# Patient Record
Sex: Female | Born: 1955 | Race: White | Hispanic: No | Marital: Married | Smoking: Former smoker
Health system: Southern US, Community
[De-identification: ages and names within clinical notes are randomized; demographics above are authoritative.]

## PROBLEM LIST (undated history)

## (undated) DIAGNOSIS — M1990 Unspecified osteoarthritis, unspecified site: Secondary | ICD-10-CM

## (undated) DIAGNOSIS — R0602 Shortness of breath: Secondary | ICD-10-CM

## (undated) DIAGNOSIS — Z8719 Personal history of other diseases of the digestive system: Secondary | ICD-10-CM

## (undated) DIAGNOSIS — K8020 Calculus of gallbladder without cholecystitis without obstruction: Secondary | ICD-10-CM

## (undated) DIAGNOSIS — J189 Pneumonia, unspecified organism: Secondary | ICD-10-CM

## (undated) DIAGNOSIS — S37009A Unspecified injury of unspecified kidney, initial encounter: Secondary | ICD-10-CM

## (undated) DIAGNOSIS — J90 Pleural effusion, not elsewhere classified: Secondary | ICD-10-CM

## (undated) DIAGNOSIS — J449 Chronic obstructive pulmonary disease, unspecified: Secondary | ICD-10-CM

## (undated) DIAGNOSIS — K219 Gastro-esophageal reflux disease without esophagitis: Secondary | ICD-10-CM

## (undated) DIAGNOSIS — I10 Essential (primary) hypertension: Secondary | ICD-10-CM

## (undated) DIAGNOSIS — E1340 Other specified diabetes mellitus with diabetic neuropathy, unspecified: Secondary | ICD-10-CM

## (undated) HISTORY — PX: ABDOMINAL HYSTERECTOMY: SHX81

## (undated) HISTORY — DX: Chronic obstructive pulmonary disease, unspecified: J44.9

## (undated) HISTORY — DX: Calculus of gallbladder without cholecystitis without obstruction: K80.20

## (undated) HISTORY — DX: Pleural effusion, not elsewhere classified: J90

## (undated) HISTORY — PX: BRONCHOSCOPY: SUR163

## (undated) HISTORY — DX: Other specified diabetes mellitus with diabetic neuropathy, unspecified: E13.40

## (undated) HISTORY — PX: BREAST SURGERY: SHX581

## (undated) HISTORY — DX: Unspecified injury of unspecified kidney, initial encounter: S37.009A

## (undated) HISTORY — DX: Pneumonia, unspecified organism: J18.9

## (undated) HISTORY — PX: THORACENTESIS: SHX235

---

## 2014-01-19 DIAGNOSIS — J9620 Acute and chronic respiratory failure, unspecified whether with hypoxia or hypercapnia: Secondary | ICD-10-CM | POA: Insufficient documentation

## 2014-01-19 DIAGNOSIS — A419 Sepsis, unspecified organism: Secondary | ICD-10-CM | POA: Insufficient documentation

## 2014-01-19 DIAGNOSIS — B999 Unspecified infectious disease: Secondary | ICD-10-CM | POA: Insufficient documentation

## 2014-01-19 DIAGNOSIS — E119 Type 2 diabetes mellitus without complications: Secondary | ICD-10-CM | POA: Insufficient documentation

## 2014-01-19 DIAGNOSIS — J189 Pneumonia, unspecified organism: Secondary | ICD-10-CM

## 2014-01-19 DIAGNOSIS — S37009A Unspecified injury of unspecified kidney, initial encounter: Secondary | ICD-10-CM | POA: Insufficient documentation

## 2014-01-19 HISTORY — DX: Pneumonia, unspecified organism: J18.9

## 2014-01-19 HISTORY — DX: Unspecified injury of unspecified kidney, initial encounter: S37.009A

## 2014-02-03 ENCOUNTER — Encounter: Payer: Self-pay | Admitting: *Deleted

## 2014-02-03 DIAGNOSIS — J90 Pleural effusion, not elsewhere classified: Secondary | ICD-10-CM | POA: Insufficient documentation

## 2014-02-03 DIAGNOSIS — J449 Chronic obstructive pulmonary disease, unspecified: Secondary | ICD-10-CM | POA: Insufficient documentation

## 2014-02-04 ENCOUNTER — Other Ambulatory Visit: Payer: Self-pay

## 2014-02-04 ENCOUNTER — Institutional Professional Consult (permissible substitution) (INDEPENDENT_AMBULATORY_CARE_PROVIDER_SITE_OTHER): Payer: Medicare Other | Admitting: Cardiothoracic Surgery

## 2014-02-04 ENCOUNTER — Encounter: Payer: Self-pay | Admitting: Cardiothoracic Surgery

## 2014-02-04 VITALS — BP 76/53 | HR 108 | Resp 18 | Ht 65.0 in | Wt 218.0 lb

## 2014-02-04 DIAGNOSIS — E1349 Other specified diabetes mellitus with other diabetic neurological complication: Secondary | ICD-10-CM | POA: Diagnosis not present

## 2014-02-04 DIAGNOSIS — E1340 Other specified diabetes mellitus with diabetic neuropathy, unspecified: Secondary | ICD-10-CM

## 2014-02-04 DIAGNOSIS — J869 Pyothorax without fistula: Secondary | ICD-10-CM

## 2014-02-04 DIAGNOSIS — J90 Pleural effusion, not elsewhere classified: Secondary | ICD-10-CM

## 2014-02-04 DIAGNOSIS — E1142 Type 2 diabetes mellitus with diabetic polyneuropathy: Secondary | ICD-10-CM

## 2014-02-04 DIAGNOSIS — K8020 Calculus of gallbladder without cholecystitis without obstruction: Secondary | ICD-10-CM | POA: Insufficient documentation

## 2014-02-04 HISTORY — DX: Other specified diabetes mellitus with diabetic neuropathy, unspecified: E13.40

## 2014-02-04 HISTORY — DX: Calculus of gallbladder without cholecystitis without obstruction: K80.20

## 2014-02-04 NOTE — Progress Notes (Signed)
[address]       [place] [address]             [phone]                    [name] [place] Medical Record #[id number] Date of Birth: May 28, 1956  Referring: Bethanie Dicker, MD Primary Care: Zollie Pee  Chief Complaint:    Chief Complaint  Patient presents with  . Pleural Effusion    left...has had thoracentesis.  Bronchoscopy by Dr. Dallas Breeding @ [facility]    History of Present Illness:    Judy Reeves 58 y.o. female is seen in the office  today for developing left empyema. The patient was recently hospitalized at [facility] approximately 2 weeks ago with a complex consolidated left lower lobe pneumonia, was treated with antibiotics cultured strep  from bronchoscopy. Moderate effusion was noted on previous CT scan. She was discharged home, which she's been gradually improving. And follow up with Dr. Eulis Foster yesterday was noted to have an increasing left pleural effusion. Thoracentesis was attempted but with little return, followup CT scan was performed demonstrating increasing left effusion/empyema. The patient has completed her course of Levaquin. She denies any fever chills. She's been on home oxygen for the past 10-15 years, quit smoking 9-10 years.  She is referred for consideration of left lung decortication.    Current Activity/ Functional Status:  Patient is independent with mobility/ambulation, transfers, ADL's, IADL's.   Zubrod Score: At the time of surgery this patient's most appropriate activity status/level should be described as: []     0    Normal activity, no symptoms []     1    Restricted in physical strenuous activity but ambulatory, able to do out light work [x]     2    Ambulatory and capable of self care, unable to do work activities, up and about               >50 % of waking hours                              []     3    Only limited self care, in bed greater than 50% of waking hours []     4    Completely disabled,  no self care, confined to bed or chair []     5    Moribund   Past Medical History  Diagnosis Date  . Pleural effusion on left   . COPD (chronic obstructive pulmonary disease)   . Neuropathy due to secondary diabetes mellitus 02/04/2014  . Gallstones 02/04/2014  . PNA (pneumonia) 01/19/2014  . Injury of kidney 01/19/2014  . COPD (chronic obstructive pulmonary disease)     Past Surgical History  Procedure Laterality Date  . Thoracentesis Left   . Bronchoscopy      DR.GOBUNSUY...[facility]   history of C-section x2 Bilateral breast tumors biopsied benign Hysterectomy  Family history: Patient's mother is alive with COPD and diabetes father is deceased of heart disease and history of stroke she has a sister and brother who are alive both with diabetes and COPD  History   Social History  . Marital Status: Married    Spouse Name: N/A    Number of Children: 3  . Years of Education: N/A   Occupational History  . Not on file.   Social History Main Topics  .  Smoking status: Former Smoker -- 2.00 packs/day for 39 years    Types: Cigarettes    Start date: 02/03/1969    Quit date: 02/04/2008  . Smokeless tobacco: Current User    Types: Snuff     Comment: POUCH  . Alcohol Use: No  . Drug Use: No  . Sexual Activity: Not on file   Other Topics Concern  . Not on file   Social History Narrative  . No narrative on file    History  Smoking status  . Former Smoker -- 2.00 packs/day for 39 years  . Types: Cigarettes  . Start date: 02/03/1969  . Quit date: 02/04/2008  Smokeless tobacco  . Current User  . Types: Snuff    Comment: POUCH    History  Alcohol Use No     No Known Allergies  Current Outpatient Prescriptions  Medication Sig Dispense Refill  . omeprazole (PRILOSEC OTC) 20 MG tablet Take 20 mg by mouth daily.      Marland Kitchen aspirin EC 81 MG tablet Take 81 mg by mouth daily.      . budesonide-formoterol (SYMBICORT) 160-4.5 MCG/ACT inhaler Inhale 1-2 puffs into the  lungs 2 (two) times daily.      Marland Kitchen gabapentin (NEURONTIN) 600 MG tablet Take 600 mg by mouth 3 (three) times daily.      . Linagliptin-Metformin HCl (JENTADUETO) 2.11-998 MG TABS Take 1 tablet by mouth daily.      . magnesium oxide (MAG-OX) 400 MG tablet Take 400 mg by mouth daily.      . pravastatin (PRAVACHOL) 20 MG tablet Take 20 mg by mouth daily.       No current facility-administered medications for this visit.     Review of Systems:     Cardiac Review of Systems: Y or N  Chest Pain [ n   ]  Resting SOB [ y  ] Exertional SOB  Cove.Etienne  ]  Orthopnea [ n ]   Pedal Edema [  n ]    Palpitations [ n ] Syncope  [ n ]   Presyncope [  n ]  General Review of Systems: [Y] = yes [  ]=no Constitional: recent weight change [n  ];  Wt loss over the last 3 months [   ] anorexia [  ]; fatigue Cove.Etienne  ]; nausea [  ]; night sweats [  ]; fever [  ]; or chills [  ];          Dental: poor dentition[y  ]; Last Dentist visit:   Eye : blurred vision Cove.Etienne  ]; diplopia [   ]; vision changes [  ];  Amaurosis fugax[  ]; Resp: cough [  ];  wheezing[  ];  hemoptysis[  ]; shortness of breath[  ]; paroxysmal nocturnal dyspnea[  ]; dyspnea on exertion[  ]; or orthopnea[  ];  GI:  gallstones[  ], vomiting[  ];  dysphagia[  ]; melena[  ];  hematochezia [  ]; heartburn[ y ];   Hx of  Colonoscopy[y  ]; GU: kidney stones [  ]; hematuria[  ];   dysuria [ y ];  nocturia[  ];  history of     obstruction [  ]; urinary frequency [  ]             Skin: rash, swelling[  ];, hair loss[  ];  peripheral edema[  ];  or itching[  ]; Musculosketetal: myalgias[  ];  joint swelling[  ];  joint erythema[  ];  joint pain[  ];  back pain[  ];  Heme/Lymph: bruising[  ];  bleeding[  ];  anemia[  ];  Neuro: TIA[  ];  headaches[ y ];  stroke[  ];  vertigo[  ];  seizures[  ];   paresthesias[  ];  difficulty walking[ y ];  Psych:depression[y  ]; anxiety[ y ];  Endocrine: diabetes[ y ];  thyroid dysfunction[  ];  Immunizations: Flu up to date Cove.Etienne  ];  Pneumococcal up to date [ y ];  Other:  Physical Exam: BP 76/53  Pulse 108  Resp 18  Ht 5\' 5"  (1.651 m)  Wt 218 lb (98.884 kg)  BMI 36.28 kg/m2  SpO2 90%  PHYSICAL EXAMINATION:  General appearance: alert, cooperative, appears older than stated age, fatigued and no distress Neurologic: intact Heart: regular rate and rhythm, S1, S2 normal, no murmur, click, rub or gallop Lungs: diminished breath sounds LLL and LUL Abdomen: soft, non-tender; bowel sounds normal; no masses,  no organomegaly Extremities: extremities normal, atraumatic, no cyanosis or edema and Homans sign is negative, no sign of DVT Patient has no carotid bruits, has 1+ DP and PT bilaterally He's on 2 L of oxygen O2 sats 90%  Diagnostic Studies & Laboratory data:     Recent Radiology Findings:  02/03/2014  <HTML><META HTTP-EQUIV="content-type" CONTENT="text/html;charset=utf-8"><P>CLINICAL DATA: Pleural effusion<BR> <BR>EXAM:<BR>CT CHEST WITHOUT CONTRAST<BR> <BR>TECHNIQUE:<BR>Multidetector CT imaging of the chest was performed following the<BR>standard protocol without IV contrast..<BR> <BR>COMPARISON: Radiograph 820 is crash that radiograph 02/03/2014<BR> <BR>FINDINGS:<BR>There is a partially loculated left pleural effusion. There is a<BR>small pocket of gas within the effusion measuring 19 mm (image 40,<BR>series 3) which may relate to thoracentesis. No pneumothorax is<BR>present. There is paraseptal emphysema in the left and right lungs.<BR>There is small amount of subcutaneous gas in the posterior left back<BR>tissue also presumably related to the thoracentesis.<BR> <BR>There is significant atelectasis in the left lower lobe with<BR>thickened bands of atelectatic pleural parenchyma. There is nodular<BR>linear thickening within the right middle lobe measuring 9 mm.<BR>Similar nodular peripheral pleural density in the superior segment<BR>left lower lobe measuring 10 mm (image 24, series 3.<BR> <BR>Limited view of the upper  abdomen demonstrates gallstones in the<BR>gallbladder. Adrenal glands normal. Review of the skeleton<BR>demonstrates no aggressive osseous lesion.<BR> <BR>IMPRESSION:<BR>1. Loculated left pleural effusion. No evidence of pneumothorax.<BR>2. Linear parenchymal bands in the left lower lobe with atelectasis.<BR>3. Several foci of nodular pleural thickening are likely post<BR>infectious or inflammatory. Recommend follow-up CT without contrast<BR>in 6 to 8 weeks to re-evaluate.<BR> <BR> <BR>Electronically Signed<BR>By: Genevive Bi M.D.<BR>On: 02/03/2014 12:58<BR></P> 01/15/2014: IMPRESSION:  1. Left basilar consolidation with cystic changes concerning for necrosis. Evaluation is limited without contrast. There is a small to moderate left effusion. 2. Hepatomegaly. 3. Cholelithiasis. 4. Hiatal hernia. 5. Enlargement of the central pulmonary arteries suggesting pulmonary tear hypertension.. In semen.   Result Narrative  TECHNIQUE: Axial CT chest abdomen and pelvis without contrast.  COMPARISON:  None  FINDINGS:  CHEST: No mediastinal adenopathy. Normal heart size. Limited evaluation of vascular structures due to absence of contrast. There is enlargement central pulmonary arteries suggesting pulmonary arterial hypertension. Coronary artery calcifications. Left pleural  effusion. Left lower lobe consolidation with cystic areas or bronchiectasis. Emphysema. Limited evaluation due to respiratory motion. Central airways are patent.  ABDOMEN AND PELVIS: . Liver: Hepatomegaly. . Gallbladder and Bile ducts: Cholelithiasis. . Pancreas: Within normal limits. . Spleen: Within normal limits. . Adrenals: Within normal limits. . . GI tract: Hiatal hernia. Fecal retention. No obstruction. Marland Kitchen Appendix: Appendectomy. . . Kidneys: Within normal  limits.  . Ureters: Within normal limits. . Urinary bladder: Within normal limits. . Reproductive: Hysterectomy. . . Vascular: Aortic and iliac atherosclerosis.  Ectasia of the infrarenal abdominal aorta. . . Peritoneum/Extraperitoneum: No free fluid or free air. . . Musculoskeletal: No acute osseous abnormality.     Bronchoscopy culture 01/23/14:[facility] results Culture, Quantitative Respiratory - Final result (01/23/2014 9:15 AM EDT)  Culture, Quantitative Respiratory - Final result (01/23/2014 9:15 AM EDT)  Component Value Range  Final Report 1,000 cfu/ml Yeast  >100,000 cfu/ml Streptococcus viridans group  Gram stain reviewed and found to be correct.  The organisms seen on gram stain but not isolated may be due to antibiotic therapy, nonviable organisms, or anaerobes.   GRAM STAIN Few White Blood Cells  Few epithelial cells  Few Gram Negative Rods  Few Gram positive cocci in pairs and clusters   Culture Status POS (A)    Recent discharge from [facility]: Hospital Course:  For full details, please see H&P and Progress Notes. Briefly, Benedict Needyatricia Williams Mcclees is a 58 y.o. female with oxygen-dependent COPD who was [facility] pneumonia. [facility] where she was diagnosed with a left basilar pneumonia. A chest CT showed a left basilar consolidation with possible necrosis. It was felt patient would benefit from pulmonary consult and possibly bronchoscopy. [facility]. [facility]. The remainder of the discharge summary will be presented in problem based format.  1. Sepsis due to pneumonia: the patient presented with dyspnea, shortness of breath, and fevers. A CT of [facility] at [facility] showed left basilar consolidation with cystic changes concerning for necrosis. Pulmonology was consulted and recommended empiric antibiotics for total of 14 days. She will complete a 14 day course of Levaquin on August 1. Bronchoscopy was performed on 7/23 with BAL and cytology pending. The patient was treated with inhalers, chest PT, and  breathing treatments. She'll follow-up with Dr. Eulis FosterBeauFord (establish with new pulmonologist) on July [facility] at [facility].  2. Acute on chronic respiratory failure: The patient was found to be in acute on chronic respiratory failure secondary to pneumonia and prior COPD. She was treated with chest PT, nebs, inhalers per RT as above. Her supplemental oxygen was weaned back to her home dose of 2 L.  3. AKI: patient was noted to have an acute kidney injury with a creatinine of 1.81 on admission. This resolved spontaneously and her creatinine was 0.5 on the day of discharge.  4. DM type 2: hemoglobin A1c on admission was 6.6%. The patient was started on metformin 500 mg twice a day and she'll be discharged with this medication. She can follow-up with her primary care doctor.      Recent Lab Findings: No results found for this basename: WBC,  HGB,  HCT,  PLT,  GLUCOSE,  CHOL,  TRIG,  HDL,  LDLDIRECT,  LDLCALC,  ALT,  AST,  NA,  K,  CL,  CREATININE,  BUN,  CO2,  TSH,  INR,  GLUF,  HGBA1C      Assessment / Plan:   Patient is a 58 year old diabetic female with long-standing COPD oxygen dependent x10 years, [facility] left lower lobe pneumonia now presents with left empyema, attempted thoracentesis was unsuccessful the patient has been referred for decortication by pulmonary service. I discussed with the patient and her husband in detail and showed them the recent CT scan.  I've recommended that we proceed tomorrow with left video-assisted thoracoscopy bronchoscopy possible left thoracotomy with decortication and drainage of empyema. The patient and her husband do not wish to proceed tomorrow but are agreeable to have surgery next week.   The patient has continued to take her antihypertensive medications after discharge from [facility],  With her blood pressure low currently and  by her history while in the hospital  we will hold any further antihypertensive  medication.  The natural history of left empyema is reviewed with the patient , the surgical procedure and risks of proceeding and risks of untreated empyema has been discussed .    In addition to empyema patient also has diabetes,Neuropathy related to diabetes, severe COPD oxygen dependent, gallstones, renal insufficiency associated with her recent admission for pneumonia/sepsis   I spent 60 minutes counseling the patient face to face. The total time spent in the appointment was 80 minutes.  Delight Ovens MD      [address] [place] [place] [address] Office [phone]   Beeper [phone]  02/04/2014 12:39 PM

## 2014-02-05 ENCOUNTER — Encounter (HOSPITAL_COMMUNITY): Payer: Self-pay | Admitting: Pharmacy Technician

## 2014-02-10 NOTE — Pre-Procedure Instructions (Signed)
Lorelle Formosaatricia M Laspina  02/10/2014   Your procedure is scheduled on:  Wednesday, August 12.  [facility] at 6:30 AM.  Call this number if you have problems the morning of surgery: [phone]   Remember:   Do not eat food or drink liquids after midnight Tuesday, August 11.   Take these medicines the morning of surgery with A SIP OF WATER: gabapentin (NEURONTIN), omeprazole (PRILOSEC OTC).     May use Inhaler.    Do not wear jewelry, make-up or nail polish.  Do not wear lotions, powders, or perfumes.   Do not shave 48 hours prior to surgery.   Do not bring valuables to the hospital.              [facility] is not responsible   for any belongings or valuables.               Contacts, dentures or bridgework may not be worn into surgery.  Leave suitcase in the car. After surgery it may be brought to your room.  For patients admitted to the hospital, discharge time is determined by your  treatment team.                 Special Instructions: Review  [facility] - Preparing For Surgery.  Please read over the following fact sheets that you were given: Pain Booklet, Coughing and Deep Breathing, Blood Transfusion Information and Surgical Site Infection Prevention

## 2014-02-11 ENCOUNTER — Encounter (HOSPITAL_COMMUNITY)
Admission: RE | Admit: 2014-02-11 | Discharge: 2014-02-11 | Disposition: A | Discharge status: Home / Self Care | Payer: Medicare Other | Source: Ambulatory Visit | Attending: Cardiothoracic Surgery | Admitting: Cardiothoracic Surgery

## 2014-02-11 ENCOUNTER — Encounter (HOSPITAL_COMMUNITY): Payer: Self-pay | Admitting: *Deleted

## 2014-02-11 ENCOUNTER — Other Ambulatory Visit: Payer: Self-pay

## 2014-02-11 VITALS — BP 128/57 | HR 91 | Temp 98.0°F | Resp 20 | Ht 66.0 in | Wt 214.5 lb

## 2014-02-11 DIAGNOSIS — J869 Pyothorax without fistula: Secondary | ICD-10-CM

## 2014-02-11 LAB — COMPREHENSIVE METABOLIC PANEL
ALT: 7 U/L (ref 0–35)
AST: 13 U/L (ref 0–37)
Albumin: 2.9 g/dL — ABNORMAL LOW (ref 3.5–5.2)
Alkaline Phosphatase: 79 U/L (ref 39–117)
Anion gap: 14 (ref 5–15)
BUN: 12 mg/dL (ref 6–23)
CO2: 21 mEq/L (ref 19–32)
Calcium: 8.7 mg/dL (ref 8.4–10.5)
Chloride: 103 mEq/L (ref 96–112)
Creatinine, Ser: 0.65 mg/dL (ref 0.50–1.10)
GFR calc Af Amer: >90 mL/min (ref >90)
GFR calc non Af Amer: >90 mL/min (ref >90)
Glucose, Bld: 120 mg/dL — ABNORMAL HIGH (ref 70–99)
Potassium: 4.1 mEq/L (ref 3.7–5.3)
Sodium: 138 mEq/L (ref 137–147)
Total Bilirubin: 0.3 mg/dL (ref 0.3–1.2)
Total Protein: 7 g/dL (ref 6.0–8.3)

## 2014-02-11 LAB — ABO/RH: ABO/RH(D): B POS

## 2014-02-11 LAB — BLOOD GAS, ARTERIAL
Acid-Base Excess: 0.1 mmol/L (ref 0.0–2.0)
Bicarbonate: 24.1 mEq/L — ABNORMAL HIGH (ref 20.0–24.0)
Drawn by: 421801
FIO2: 0.21 %
O2 Saturation: 93.3 %
Patient temperature: 98.6
TCO2: 25.2 mmol/L (ref 0–100)
pCO2 arterial: 37.8 mmHg (ref 35.0–45.0)
pH, Arterial: 7.42 (ref 7.350–7.450)
pO2, Arterial: 63.4 mmHg — ABNORMAL LOW (ref 80.0–100.0)

## 2014-02-11 LAB — PROTIME-INR
INR: 0.97 (ref 0.00–1.49)
Prothrombin Time: 12.9 seconds (ref 11.6–15.2)

## 2014-02-11 LAB — CBC
HCT: 32.2 % — ABNORMAL LOW (ref 36.0–46.0)
Hemoglobin: 10 g/dL — ABNORMAL LOW (ref 12.0–15.0)
MCH: 26.4 pg (ref 26.0–34.0)
MCHC: 31.1 g/dL (ref 30.0–36.0)
MCV: 85 fL (ref 78.0–100.0)
Platelets: 253 10*3/uL (ref 150–400)
RBC: 3.79 MIL/uL — ABNORMAL LOW (ref 3.87–5.11)
RDW: 15.5 % (ref 11.5–15.5)
WBC: 6.4 10*3/uL (ref 4.0–10.5)

## 2014-02-11 LAB — SURGICAL PCR SCREEN
MRSA, PCR: NEGATIVE
Staphylococcus aureus: NEGATIVE

## 2014-02-11 LAB — APTT: aPTT: 24 seconds (ref 24–37)

## 2014-02-11 LAB — TYPE AND SCREEN
ABO/RH(D): B POS
Antibody Screen: NEGATIVE

## 2014-02-11 LAB — URINALYSIS, ROUTINE W REFLEX MICROSCOPIC
Bilirubin Urine: NEGATIVE
Glucose, UA: NEGATIVE mg/dL
Hgb urine dipstick: NEGATIVE
Ketones, ur: NEGATIVE mg/dL
Leukocytes, UA: NEGATIVE
Nitrite: NEGATIVE
Protein, ur: NEGATIVE mg/dL
Specific Gravity, Urine: 1.025 (ref 1.005–1.030)
Urobilinogen, UA: 1 mg/dL (ref 0.0–1.0)
pH: 6 (ref 5.0–8.0)

## 2014-02-11 MED ORDER — DEXTROSE 5 % IV SOLN
1.5000 g | INTRAVENOUS | Status: AC
  Administered 2014-02-12: 1.5 g via INTRAVENOUS
  Filled 2014-02-11: qty 1.5

## 2014-02-12 ENCOUNTER — Encounter (HOSPITAL_COMMUNITY): Payer: Self-pay | Admitting: Anesthesiology

## 2014-02-12 ENCOUNTER — Inpatient Hospital Stay (HOSPITAL_COMMUNITY): Payer: Medicare Other | Admitting: Anesthesiology

## 2014-02-12 ENCOUNTER — Encounter (HOSPITAL_COMMUNITY): Payer: Medicare Other | Admitting: Anesthesiology

## 2014-02-12 ENCOUNTER — Encounter (HOSPITAL_COMMUNITY)
Admission: RE | Disposition: A | Discharge status: Home / Self Care | Payer: Self-pay | Source: Ambulatory Visit | Attending: Cardiothoracic Surgery

## 2014-02-12 ENCOUNTER — Inpatient Hospital Stay (HOSPITAL_COMMUNITY): Payer: Medicare Other

## 2014-02-12 ENCOUNTER — Inpatient Hospital Stay (HOSPITAL_COMMUNITY)
Admission: RE | Admit: 2014-02-12 | Discharge: 2014-02-16 | DRG: 165 | Disposition: A | Discharge status: Home / Self Care | Payer: Medicare Other | Source: Ambulatory Visit | Attending: Cardiothoracic Surgery | Admitting: Cardiothoracic Surgery

## 2014-02-12 DIAGNOSIS — Z87891 Personal history of nicotine dependence: Secondary | ICD-10-CM | POA: Diagnosis not present

## 2014-02-12 DIAGNOSIS — J9620 Acute and chronic respiratory failure, unspecified whether with hypoxia or hypercapnia: Secondary | ICD-10-CM

## 2014-02-12 DIAGNOSIS — Z6834 Body mass index (BMI) 34.0-34.9, adult: Secondary | ICD-10-CM | POA: Diagnosis not present

## 2014-02-12 DIAGNOSIS — I129 Hypertensive chronic kidney disease with stage 1 through stage 4 chronic kidney disease, or unspecified chronic kidney disease: Secondary | ICD-10-CM | POA: Diagnosis present

## 2014-02-12 DIAGNOSIS — J869 Pyothorax without fistula: Secondary | ICD-10-CM | POA: Diagnosis present

## 2014-02-12 DIAGNOSIS — J438 Other emphysema: Secondary | ICD-10-CM

## 2014-02-12 DIAGNOSIS — J4489 Other specified chronic obstructive pulmonary disease: Secondary | ICD-10-CM | POA: Diagnosis present

## 2014-02-12 DIAGNOSIS — E8809 Other disorders of plasma-protein metabolism, not elsewhere classified: Secondary | ICD-10-CM | POA: Diagnosis not present

## 2014-02-12 DIAGNOSIS — N189 Chronic kidney disease, unspecified: Secondary | ICD-10-CM | POA: Diagnosis present

## 2014-02-12 DIAGNOSIS — Z01810 Encounter for preprocedural cardiovascular examination: Secondary | ICD-10-CM | POA: Diagnosis not present

## 2014-02-12 DIAGNOSIS — E1142 Type 2 diabetes mellitus with diabetic polyneuropathy: Secondary | ICD-10-CM | POA: Diagnosis present

## 2014-02-12 DIAGNOSIS — Z9981 Dependence on supplemental oxygen: Secondary | ICD-10-CM

## 2014-02-12 DIAGNOSIS — Z8701 Personal history of pneumonia (recurrent): Secondary | ICD-10-CM | POA: Diagnosis not present

## 2014-02-12 DIAGNOSIS — Z01812 Encounter for preprocedural laboratory examination: Secondary | ICD-10-CM

## 2014-02-12 DIAGNOSIS — Z79899 Other long term (current) drug therapy: Secondary | ICD-10-CM

## 2014-02-12 DIAGNOSIS — E785 Hyperlipidemia, unspecified: Secondary | ICD-10-CM | POA: Diagnosis present

## 2014-02-12 DIAGNOSIS — E1129 Type 2 diabetes mellitus with other diabetic kidney complication: Secondary | ICD-10-CM | POA: Diagnosis present

## 2014-02-12 DIAGNOSIS — D638 Anemia in other chronic diseases classified elsewhere: Secondary | ICD-10-CM | POA: Diagnosis present

## 2014-02-12 DIAGNOSIS — K219 Gastro-esophageal reflux disease without esophagitis: Secondary | ICD-10-CM | POA: Diagnosis present

## 2014-02-12 DIAGNOSIS — E669 Obesity, unspecified: Secondary | ICD-10-CM | POA: Diagnosis present

## 2014-02-12 DIAGNOSIS — R0602 Shortness of breath: Secondary | ICD-10-CM | POA: Diagnosis present

## 2014-02-12 DIAGNOSIS — J449 Chronic obstructive pulmonary disease, unspecified: Secondary | ICD-10-CM | POA: Diagnosis present

## 2014-02-12 DIAGNOSIS — Z7982 Long term (current) use of aspirin: Secondary | ICD-10-CM

## 2014-02-12 DIAGNOSIS — E1149 Type 2 diabetes mellitus with other diabetic neurological complication: Secondary | ICD-10-CM | POA: Diagnosis present

## 2014-02-12 HISTORY — DX: Personal history of other diseases of the digestive system: Z87.19

## 2014-02-12 HISTORY — PX: VIDEO ASSISTED THORACOSCOPY (VATS)/DECORTICATION: SHX6171

## 2014-02-12 HISTORY — DX: Unspecified osteoarthritis, unspecified site: M19.90

## 2014-02-12 HISTORY — DX: Shortness of breath: R06.02

## 2014-02-12 HISTORY — PX: EMPYEMA DRAINAGE: SHX5097

## 2014-02-12 HISTORY — PX: VIDEO BRONCHOSCOPY: SHX5072

## 2014-02-12 HISTORY — DX: Essential (primary) hypertension: I10

## 2014-02-12 HISTORY — DX: Gastro-esophageal reflux disease without esophagitis: K21.9

## 2014-02-12 LAB — POCT I-STAT 3, ART BLOOD GAS (G3+)
Acid-Base Excess: 2 mmol/L (ref 0.0–2.0)
Bicarbonate: 27 mEq/L — ABNORMAL HIGH (ref 20.0–24.0)
O2 Saturation: 94 %
Patient temperature: 97.9
TCO2: 28 mmol/L (ref 0–100)
pCO2 arterial: 45 mmHg (ref 35.0–45.0)
pH, Arterial: 7.385 (ref 7.350–7.450)
pO2, Arterial: 73 mmHg — ABNORMAL LOW (ref 80.0–100.0)

## 2014-02-12 LAB — GLUCOSE, CAPILLARY
Glucose-Capillary: 103 mg/dL — ABNORMAL HIGH (ref 70–99)
Glucose-Capillary: 97 mg/dL (ref 70–99)
Glucose-Capillary: 110 mg/dL — ABNORMAL HIGH (ref 70–99)
Glucose-Capillary: 123 mg/dL — ABNORMAL HIGH (ref 70–99)

## 2014-02-12 SURGERY — BRONCHOSCOPY, VIDEO-ASSISTED
Anesthesia: General | Site: Chest

## 2014-02-12 MED ORDER — ONDANSETRON HCL 4 MG/2ML IJ SOLN: 4.0000 mg | Freq: Once | INTRAMUSCULAR | Status: DC | PRN

## 2014-02-12 MED ORDER — PROPOFOL 10 MG/ML IV BOLUS
INTRAVENOUS | Status: DC | PRN
  Administered 2014-02-12: 150 mg via INTRAVENOUS

## 2014-02-12 MED ORDER — DEXTROSE 5 % IV SOLN
1.0000 g | INTRAVENOUS | Status: DC
  Administered 2014-02-12 – 2014-02-15 (×4): 1 g via INTRAVENOUS
  Filled 2014-02-12 (×5): qty 10

## 2014-02-12 MED ORDER — PHENYLEPHRINE HCL 10 MG/ML IJ SOLN
10.0000 mg | INTRAVENOUS | Status: DC | PRN
  Administered 2014-02-12: 15 ug/min via INTRAVENOUS

## 2014-02-12 MED ORDER — SUFENTANIL CITRATE 50 MCG/ML IV SOLN
INTRAVENOUS | Status: DC | PRN
  Administered 2014-02-12: 10 ug via INTRAVENOUS
  Administered 2014-02-12 (×2): 5 ug via INTRAVENOUS
  Administered 2014-02-12 (×3): 10 ug via INTRAVENOUS

## 2014-02-12 MED ORDER — MAGNESIUM OXIDE 400 (241.3 MG) MG PO TABS
400.0000 mg | ORAL_TABLET | Freq: Every day | ORAL | Status: DC
  Administered 2014-02-13 – 2014-02-16 (×4): 400 mg via ORAL
  Filled 2014-02-12 (×4): qty 1

## 2014-02-12 MED ORDER — ROCURONIUM BROMIDE 100 MG/10ML IV SOLN
INTRAVENOUS | Status: DC | PRN
  Administered 2014-02-12: 50 mg via INTRAVENOUS
  Administered 2014-02-12: 20 mg via INTRAVENOUS

## 2014-02-12 MED ORDER — ROCURONIUM BROMIDE 50 MG/5ML IV SOLN
INTRAVENOUS | Status: AC
  Filled 2014-02-12: qty 1

## 2014-02-12 MED ORDER — SUFENTANIL CITRATE 50 MCG/ML IV SOLN
INTRAVENOUS | Status: AC
  Filled 2014-02-12: qty 1

## 2014-02-12 MED ORDER — SENNOSIDES-DOCUSATE SODIUM 8.6-50 MG PO TABS
1.0000 | ORAL_TABLET | Freq: Every day | ORAL | Status: DC
  Administered 2014-02-14 – 2014-02-15 (×2): 1 via ORAL
  Filled 2014-02-12 (×5): qty 1

## 2014-02-12 MED ORDER — LINAGLIPTIN 5 MG PO TABS
5.0000 mg | ORAL_TABLET | Freq: Every day | ORAL | Status: DC
  Administered 2014-02-13 – 2014-02-16 (×4): 5 mg via ORAL
  Filled 2014-02-12 (×4): qty 1

## 2014-02-12 MED ORDER — OXYCODONE HCL 5 MG PO TABS: 5.0000 mg | ORAL_TABLET | Freq: Once | ORAL | Status: DC | PRN

## 2014-02-12 MED ORDER — LACTATED RINGERS IV SOLN
INTRAVENOUS | Status: DC | PRN
  Administered 2014-02-12 (×2): via INTRAVENOUS

## 2014-02-12 MED ORDER — BISACODYL 5 MG PO TBEC
10.0000 mg | DELAYED_RELEASE_TABLET | Freq: Every day | ORAL | Status: DC
  Administered 2014-02-13 – 2014-02-15 (×3): 10 mg via ORAL
  Filled 2014-02-12 (×3): qty 2

## 2014-02-12 MED ORDER — METFORMIN HCL 500 MG PO TABS
1000.0000 mg | ORAL_TABLET | Freq: Two times a day (BID) | ORAL | Status: DC
  Administered 2014-02-13 (×2): 1000 mg via ORAL
  Filled 2014-02-12 (×6): qty 2

## 2014-02-12 MED ORDER — POTASSIUM CHLORIDE 10 MEQ/50ML IV SOLN: 10.0000 meq | Freq: Every day | INTRAVENOUS | Status: DC | PRN

## 2014-02-12 MED ORDER — GABAPENTIN 600 MG PO TABS
600.0000 mg | ORAL_TABLET | Freq: Three times a day (TID) | ORAL | Status: DC
  Administered 2014-02-13 – 2014-02-16 (×9): 600 mg via ORAL
  Filled 2014-02-12 (×12): qty 1

## 2014-02-12 MED ORDER — CEFUROXIME SODIUM 1.5 G IJ SOLR: 1.5000 g | Freq: Two times a day (BID) | INTRAMUSCULAR | Status: DC

## 2014-02-12 MED ORDER — GLYCOPYRROLATE 0.2 MG/ML IJ SOLN
INTRAMUSCULAR | Status: DC | PRN
  Administered 2014-02-12: .8 mg via INTRAVENOUS
  Administered 2014-02-12: 0.2 mg via INTRAVENOUS

## 2014-02-12 MED ORDER — FENTANYL CITRATE 0.05 MG/ML IJ SOLN: 25.0000 ug | INTRAMUSCULAR | Status: DC | PRN

## 2014-02-12 MED ORDER — SIMVASTATIN 10 MG PO TABS
10.0000 mg | ORAL_TABLET | Freq: Every day | ORAL | Status: DC
  Administered 2014-02-13 – 2014-02-15 (×3): 10 mg via ORAL
  Filled 2014-02-12 (×4): qty 1

## 2014-02-12 MED ORDER — ONDANSETRON HCL 4 MG/2ML IJ SOLN
INTRAMUSCULAR | Status: DC | PRN
  Administered 2014-02-12: 4 mg via INTRAVENOUS

## 2014-02-12 MED ORDER — FENTANYL 10 MCG/ML IV SOLN
INTRAVENOUS | Status: DC
  Administered 2014-02-13: 30 ug via INTRAVENOUS
  Administered 2014-02-13 (×3): 15 ug via INTRAVENOUS
  Administered 2014-02-14: 30 ug via INTRAVENOUS
  Administered 2014-02-14: 15 ug via INTRAVENOUS
  Administered 2014-02-15: 30 ug via INTRAVENOUS
  Filled 2014-02-12 (×2): qty 50

## 2014-02-12 MED ORDER — ONDANSETRON HCL 4 MG/2ML IJ SOLN: 4.0000 mg | Freq: Four times a day (QID) | INTRAMUSCULAR | Status: DC | PRN

## 2014-02-12 MED ORDER — ACETAMINOPHEN 160 MG/5ML PO SOLN
1000.0000 mg | Freq: Four times a day (QID) | ORAL | Status: DC
  Administered 2014-02-13 (×2): 1000 mg via ORAL
  Filled 2014-02-12 (×2): qty 40.6

## 2014-02-12 MED ORDER — OXYCODONE HCL 5 MG/5ML PO SOLN: 5.0000 mg | Freq: Once | ORAL | Status: DC | PRN

## 2014-02-12 MED ORDER — NALOXONE HCL 0.4 MG/ML IJ SOLN
0.4000 mg | INTRAMUSCULAR | Status: DC | PRN
  Filled 2014-02-12: qty 1

## 2014-02-12 MED ORDER — ONDANSETRON HCL 4 MG/2ML IJ SOLN
INTRAMUSCULAR | Status: AC
  Filled 2014-02-12: qty 2

## 2014-02-12 MED ORDER — LEVALBUTEROL HCL 0.63 MG/3ML IN NEBU
0.6300 mg | INHALATION_SOLUTION | Freq: Four times a day (QID) | RESPIRATORY_TRACT | Status: DC
  Administered 2014-02-13 – 2014-02-14 (×6): 0.63 mg via RESPIRATORY_TRACT
  Filled 2014-02-12 (×12): qty 3

## 2014-02-12 MED ORDER — INSULIN ASPART 100 UNIT/ML ~~LOC~~ SOLN
0.0000 [IU] | Freq: Three times a day (TID) | SUBCUTANEOUS | Status: DC
Start: 2014-02-12 — End: 2014-02-16
  Administered 2014-02-12 – 2014-02-15 (×6): 2 [IU] via SUBCUTANEOUS
  Administered 2014-02-15: 4 [IU] via SUBCUTANEOUS
  Administered 2014-02-15 – 2014-02-16 (×3): 2 [IU] via SUBCUTANEOUS

## 2014-02-12 MED ORDER — SODIUM CHLORIDE 0.9 % IV SOLN
1500.0000 mg | Freq: Two times a day (BID) | INTRAVENOUS | Status: DC
  Administered 2014-02-12 – 2014-02-13 (×3): 1500 mg via INTRAVENOUS
  Filled 2014-02-12 (×5): qty 1500

## 2014-02-12 MED ORDER — DIPHENHYDRAMINE HCL 12.5 MG/5ML PO ELIX
12.5000 mg | ORAL_SOLUTION | Freq: Four times a day (QID) | ORAL | Status: DC | PRN
  Filled 2014-02-12: qty 5

## 2014-02-12 MED ORDER — MIDAZOLAM HCL 2 MG/2ML IJ SOLN
INTRAMUSCULAR | Status: AC
  Filled 2014-02-12: qty 2

## 2014-02-12 MED ORDER — ASPIRIN EC 81 MG PO TBEC
81.0000 mg | DELAYED_RELEASE_TABLET | Freq: Every day | ORAL | Status: DC
  Administered 2014-02-13 – 2014-02-16 (×4): 81 mg via ORAL
  Filled 2014-02-12 (×4): qty 1

## 2014-02-12 MED ORDER — OMEPRAZOLE 20 MG PO CPDR
20.0000 mg | DELAYED_RELEASE_CAPSULE | Freq: Every day | ORAL | Status: DC
Start: 2014-02-13 — End: 2014-02-16
  Administered 2014-02-13 – 2014-02-16 (×4): 20 mg via ORAL
  Filled 2014-02-12 (×4): qty 1

## 2014-02-12 MED ORDER — BUDESONIDE-FORMOTEROL FUMARATE 160-4.5 MCG/ACT IN AERO
1.0000 | INHALATION_SPRAY | Freq: Two times a day (BID) | RESPIRATORY_TRACT | Status: DC
  Administered 2014-02-13 – 2014-02-14 (×4): 2 via RESPIRATORY_TRACT
  Administered 2014-02-15: 1 via RESPIRATORY_TRACT
  Administered 2014-02-15 – 2014-02-16 (×2): 2 via RESPIRATORY_TRACT
  Filled 2014-02-12: qty 6

## 2014-02-12 MED ORDER — LIDOCAINE HCL (CARDIAC) 20 MG/ML IV SOLN
INTRAVENOUS | Status: AC
  Filled 2014-02-12: qty 5

## 2014-02-12 MED ORDER — ACETAMINOPHEN 500 MG PO TABS
1000.0000 mg | ORAL_TABLET | Freq: Four times a day (QID) | ORAL | Status: DC
  Administered 2014-02-13 – 2014-02-16 (×10): 1000 mg via ORAL
  Filled 2014-02-12 (×17): qty 2

## 2014-02-12 MED ORDER — DIPHENHYDRAMINE HCL 50 MG/ML IJ SOLN: 12.5000 mg | Freq: Four times a day (QID) | INTRAMUSCULAR | Status: DC | PRN

## 2014-02-12 MED ORDER — NEOSTIGMINE METHYLSULFATE 10 MG/10ML IV SOLN
INTRAVENOUS | Status: DC | PRN
  Administered 2014-02-12: 4 mg via INTRAVENOUS

## 2014-02-12 MED ORDER — OXYCODONE HCL 5 MG PO TABS
5.0000 mg | ORAL_TABLET | ORAL | Status: DC | PRN
  Administered 2014-02-15: 5 mg via ORAL
  Filled 2014-02-12: qty 1

## 2014-02-12 MED ORDER — MIDAZOLAM HCL 5 MG/5ML IJ SOLN
INTRAMUSCULAR | Status: DC | PRN
  Administered 2014-02-12 (×2): 1 mg via INTRAVENOUS

## 2014-02-12 MED ORDER — GLYCOPYRROLATE 0.2 MG/ML IJ SOLN: INTRAMUSCULAR | Status: DC | PRN

## 2014-02-12 MED ORDER — PROPOFOL 10 MG/ML IV BOLUS
INTRAVENOUS | Status: AC
  Filled 2014-02-12: qty 20

## 2014-02-12 MED ORDER — POTASSIUM CHLORIDE IN NACL 20-0.45 MEQ/L-% IV SOLN
INTRAVENOUS | Status: DC
  Administered 2014-02-12 – 2014-02-14 (×3): via INTRAVENOUS
  Filled 2014-02-12 (×5): qty 1000

## 2014-02-12 MED ORDER — GLYCOPYRROLATE 0.2 MG/ML IJ SOLN
INTRAMUSCULAR | Status: AC
  Filled 2014-02-12: qty 2

## 2014-02-12 MED ORDER — LACTATED RINGERS IV SOLN
INTRAVENOUS | Status: DC
  Administered 2014-02-12: 12:00:00 via INTRAVENOUS

## 2014-02-12 MED ORDER — LIDOCAINE HCL (CARDIAC) 20 MG/ML IV SOLN
INTRAVENOUS | Status: DC | PRN
  Administered 2014-02-12: 50 mg via INTRAVENOUS

## 2014-02-12 MED ORDER — NEOSTIGMINE METHYLSULFATE 10 MG/10ML IV SOLN
INTRAVENOUS | Status: AC
  Filled 2014-02-12: qty 1

## 2014-02-12 MED ORDER — 0.9 % SODIUM CHLORIDE (POUR BTL) OPTIME
TOPICAL | Status: DC | PRN
  Administered 2014-02-12: 3000 mL

## 2014-02-12 MED ORDER — SODIUM CHLORIDE 0.9 % IJ SOLN
9.0000 mL | INTRAMUSCULAR | Status: DC | PRN
  Administered 2014-02-13: 9 mL via INTRAVENOUS

## 2014-02-12 SURGICAL SUPPLY — 83 items
APPLICATOR TIP COSEAL (VASCULAR PRODUCTS) IMPLANT
APPLICATOR TIP EXT COSEAL (VASCULAR PRODUCTS) IMPLANT
BLADE SURG 11 STRL SS (BLADE) IMPLANT
BRUSH CYTOL CELLEBRITY 1.5X140 (MISCELLANEOUS) IMPLANT
CANISTER SUCTION 2500CC (MISCELLANEOUS) ×3 IMPLANT
CATH KIT ON Q 5IN SLV (PAIN MANAGEMENT) IMPLANT
CATH THORACIC 28FR (CATHETERS) IMPLANT
CATH THORACIC 36FR (CATHETERS) IMPLANT
CATH THORACIC 36FR RT ANG (CATHETERS) IMPLANT
CLEANER TIP ELECTROSURG 2X2 (MISCELLANEOUS) ×3 IMPLANT
CLIP TI MEDIUM 6 (CLIP) IMPLANT
CONN ST 1/4X3/8  BEN (MISCELLANEOUS)
CONN ST 1/4X3/8 BEN (MISCELLANEOUS) IMPLANT
CONN Y 3/8X3/8X3/8  BEN (MISCELLANEOUS)
CONN Y 3/8X3/8X3/8 BEN (MISCELLANEOUS) IMPLANT
CONT SPEC 4OZ CLIKSEAL STRL BL (MISCELLANEOUS) ×6 IMPLANT
COVER SURGICAL LIGHT HANDLE (MISCELLANEOUS) ×3 IMPLANT
COVER TABLE BACK 60X90 (DRAPES) ×3 IMPLANT
DERMABOND ADVANCED (GAUZE/BANDAGES/DRESSINGS)
DERMABOND ADVANCED .7 DNX12 (GAUZE/BANDAGES/DRESSINGS) IMPLANT
DRAIN CHANNEL 28F RND 3/8 FF (WOUND CARE) IMPLANT
DRAIN CHANNEL 32F RND 10.7 FF (WOUND CARE) ×3 IMPLANT
DRAPE LAPAROSCOPIC ABDOMINAL (DRAPES) ×3 IMPLANT
DRAPE WARM FLUID 44X44 (DRAPE) ×3 IMPLANT
DRILL BIT 7/64X5 (BIT) IMPLANT
ELECT BLADE 4.0 EZ CLEAN MEGAD (MISCELLANEOUS) ×3
ELECT REM PT RETURN 9FT ADLT (ELECTROSURGICAL) ×3
ELECTRODE BLDE 4.0 EZ CLN MEGD (MISCELLANEOUS) ×2 IMPLANT
ELECTRODE REM PT RTRN 9FT ADLT (ELECTROSURGICAL) ×2 IMPLANT
FORCEPS BIOP RJ4 1.8 (CUTTING FORCEPS) IMPLANT
GAUZE SPONGE 4X4 12PLY STRL (GAUZE/BANDAGES/DRESSINGS) ×3 IMPLANT
GAUZE SPONGE 4X4 16PLY XRAY LF (GAUZE/BANDAGES/DRESSINGS) ×3 IMPLANT
GLOVE BIO SURGEON STRL SZ 6.5 (GLOVE) ×6 IMPLANT
GLOVE BIOGEL PI IND STRL 6.5 (GLOVE) ×4 IMPLANT
GLOVE BIOGEL PI IND STRL 7.0 (GLOVE) ×2 IMPLANT
GLOVE BIOGEL PI INDICATOR 6.5 (GLOVE) ×2
GLOVE BIOGEL PI INDICATOR 7.0 (GLOVE) ×1
GOWN STRL REUS W/ TWL LRG LVL3 (GOWN DISPOSABLE) ×6 IMPLANT
GOWN STRL REUS W/TWL LRG LVL3 (GOWN DISPOSABLE) ×3
KIT BASIN OR (CUSTOM PROCEDURE TRAY) ×3 IMPLANT
KIT ROOM TURNOVER OR (KITS) ×3 IMPLANT
KIT SUCTION CATH 14FR (SUCTIONS) ×3 IMPLANT
MARKER SKIN DUAL TIP RULER LAB (MISCELLANEOUS) ×3 IMPLANT
NEEDLE BIOPSY TRANSBRONCH 21G (NEEDLE) IMPLANT
NS IRRIG 1000ML POUR BTL (IV SOLUTION) ×12 IMPLANT
OIL SILICONE PENTAX (PARTS (SERVICE/REPAIRS)) ×3 IMPLANT
PACK CHEST (CUSTOM PROCEDURE TRAY) ×3 IMPLANT
PAD ARMBOARD 7.5X6 YLW CONV (MISCELLANEOUS) ×6 IMPLANT
PASSER SUT SWANSON 36MM LOOP (INSTRUMENTS) IMPLANT
SCISSORS LAP 5X35 DISP (ENDOMECHANICALS) IMPLANT
SEALANT PROGEL (MISCELLANEOUS) IMPLANT
SEALANT SURG COSEAL 4ML (VASCULAR PRODUCTS) IMPLANT
SEALANT SURG COSEAL 8ML (VASCULAR PRODUCTS) IMPLANT
SOLUTION ANTI FOG 6CC (MISCELLANEOUS) ×3 IMPLANT
SUT PROLENE 3 0 SH DA (SUTURE) IMPLANT
SUT PROLENE 4 0 RB 1 (SUTURE)
SUT PROLENE 4-0 RB1 .5 CRCL 36 (SUTURE) IMPLANT
SUT SILK  1 MH (SUTURE) ×2
SUT SILK 1 MH (SUTURE) ×4 IMPLANT
SUT SILK 2 0SH CR/8 30 (SUTURE) IMPLANT
SUT SILK 3 0SH CR/8 30 (SUTURE) IMPLANT
SUT VIC AB 1 CTX 18 (SUTURE) IMPLANT
SUT VIC AB 1 CTX 36 (SUTURE)
SUT VIC AB 1 CTX36XBRD ANBCTR (SUTURE) IMPLANT
SUT VIC AB 2-0 CTX 36 (SUTURE) IMPLANT
SUT VIC AB 3-0 X1 27 (SUTURE) IMPLANT
SUT VICRYL 2 TP 1 (SUTURE) IMPLANT
SWAB COLLECTION DEVICE MRSA (MISCELLANEOUS) IMPLANT
SYR 20ML ECCENTRIC (SYRINGE) ×3 IMPLANT
SYSTEM SAHARA CHEST DRAIN ATS (WOUND CARE) ×3 IMPLANT
TAPE CLOTH 4X10 WHT NS (GAUZE/BANDAGES/DRESSINGS) ×3 IMPLANT
TAPE CLOTH SURG 4X10 WHT LF (GAUZE/BANDAGES/DRESSINGS) ×3 IMPLANT
TAPE UMBILICAL COTTON 1/8X30 (MISCELLANEOUS) ×3 IMPLANT
TIP APPLICATOR SPRAY EXTEND 16 (VASCULAR PRODUCTS) IMPLANT
TOWEL OR 17X24 6PK STRL BLUE (TOWEL DISPOSABLE) ×6 IMPLANT
TOWEL OR 17X26 10 PK STRL BLUE (TOWEL DISPOSABLE) ×6 IMPLANT
TRAP SPECIMEN MUCOUS 40CC (MISCELLANEOUS) ×3 IMPLANT
TRAY FOLEY CATH 14FRSI W/METER (CATHETERS) ×3 IMPLANT
TROCAR XCEL BLUNT TIP 100MML (ENDOMECHANICALS) IMPLANT
TUBE ANAEROBIC SPECIMEN COL (MISCELLANEOUS) IMPLANT
TUBE CONNECTING 12X1/4 (SUCTIONS) ×6 IMPLANT
TUNNELER SHEATH ON-Q 11GX8 DSP (PAIN MANAGEMENT) IMPLANT
WATER STERILE IRR 1000ML POUR (IV SOLUTION) ×6 IMPLANT

## 2014-02-12 NOTE — Progress Notes (Signed)
Chest Xray done.

## 2014-02-12 NOTE — Brief Op Note (Addendum)
      [address]       [place] [id number]             [phone]     02/12/2014  5:45 PM  PATIENT:  Judy Reeves  58 y.o. female  PRE-OPERATIVE DIAGNOSIS:  left empyema  POST-OPERATIVE DIAGNOSIS:  left empyema  PROCEDURE:  Procedure(s):  VIDEO BRONCHOSCOPY   VIDEO ASSISTED THORACOSCOPY / DECORTICATION (Left) -Drainage of Empyema  SURGEON:  Surgeon(s) and Role:    * Delight OvensEdward B Jerricka Carvey, MD - Primary  PHYSICIAN ASSISTANT: Lowella DandyErin Barrett PA-C  ANESTHESIA:   general  EBL:  Total I/O In: -  Out: 75 [Urine:75]  BLOOD ADMINISTERED:none  DRAINS: Blake Drain Left chest   LOCAL MEDICATIONS USED:  NONE  SPECIMEN:  Source of Specimen:  Left Pleural Fluid, Pleural Peel  DISPOSITION OF SPECIMEN:  Cytology, Microbiology, Pathology  COUNTS:  YES  DICTATION: .Dragon Dictation  PLAN OF CARE: Admit to inpatient   PATIENT DISPOSITION:  PACU - hemodynamically stable.   Delay start of Pharmacological VTE agent (>24hrs) due to surgical blood loss or risk of bleeding: yes

## 2014-02-12 NOTE — Anesthesia Procedure Notes (Addendum)
Procedure Name: Intubation Date/Time: 02/12/2014 3:42 PM Performed by: Gwenyth AllegraADAMI, Arshdeep Bolger Pre-anesthesia Checklist: Emergency Drugs available, Timeout performed, Patient identified, Patient being monitored and Suction available Patient Re-evaluated:Patient Re-evaluated prior to inductionOxygen Delivery Method: Circle system utilized Preoxygenation: Pre-oxygenation with 100% oxygen Intubation Type: IV induction Ventilation: Mask ventilation without difficulty and Oral airway inserted - appropriate to patient size Grade View: Grade III Tube type: Oral Tube size: 8.5 mm Number of attempts: 2 Airway Equipment and Method: Video-laryngoscopy Placement Confirmation: ETT inserted through vocal cords under direct vision,  breath sounds checked- equal and bilateral and positive ETCO2 Secured at: 22 cm Dental Injury: Teeth and Oropharynx as per pre-operative assessment  Difficulty Due To: Difficulty was anticipated, Difficult Airway- due to reduced neck mobility and Difficult Airway- due to anterior larynx Future Recommendations: Recommend- induction with short-acting agent, and alternative techniques readily available    Procedure Name: Intubation Date/Time: 02/12/2014 4:27 PM Performed by: Gwenyth AllegraADAMI, Adriel Kessen Pre-anesthesia Checklist: Patient identified, Patient being monitored, Emergency Drugs available, Timeout performed and Suction available Oxygen Delivery Method: Circle system utilized Preoxygenation: Pre-oxygenation with 100% oxygen Laryngoscope Size: Mac and 3 Grade View: Grade III Endobronchial tube: Left, Double lumen EBT, EBT position confirmed by auscultation and EBT position confirmed by fiberoptic bronchoscope Number of attempts: 1 Airway Equipment and Method: Bougie stylet Placement Confirmation: positive ETCO2 and breath sounds checked- equal and bilateral Secured at: 28 cm Tube secured with: Tape Dental Injury: Teeth and Oropharynx as per pre-operative assessment  Difficulty Due To:  Difficulty was anticipated, Difficult Airway- due to reduced neck mobility and Difficult Airway- due to anterior larynx Future Recommendations: Recommend- induction with short-acting agent, and alternative techniques readily available

## 2014-02-12 NOTE — H&P (Signed)
[address]       [place] [address]             [phone]                    Judy Reeves [facility] Medical Record #[id number] Date of Birth: 01-21-1956  Referring: Bethanie Dicker, MD Primary Care: Zollie Pee  Chief Complaint:    Left effusion Warnell Forester    History of Present Illness:    Judy Reeves 58 y.o. female is seen in the office last week for developing left empyema. The patient was recently hospitalized at [facility] approximately 2 weeks ago with a complex consolidated left lower lobe pneumonia, was treated with antibiotics cultured strep  from bronchoscopy. Moderate effusion was noted on previous CT scan. She was discharged home, which she's been gradually improving. And follow up with Dr. Eulis Foster yesterday was noted to have an increasing left pleural effusion. Thoracentesis was attempted but with little return, followup CT scan was performed demonstrating increasing left effusion/empyema. The patient has completed her course of Levaquin. She denies any fever chills. She's been on home oxygen for the past 10-15 years, quit smoking 9-10 years.  She is referred for consideration of left lung decortication.    Current Activity/ Functional Status:  Patient is independent with mobility/ambulation, transfers, ADL's, IADL's.   Zubrod Score: At the time of surgery this patient's most appropriate activity status/level should be described as: []     0    Normal activity, no symptoms []     1    Restricted in physical strenuous activity but ambulatory, able to do out light work [x]     2    Ambulatory and capable of self care, unable to do work activities, up and about               >50 % of waking hours                              []     3    Only limited self care, in bed greater than 50% of waking hours []     4    Completely disabled, no self care, confined to bed or chair []     5    Moribund   Past Medical History  Diagnosis Date  .  Pleural effusion on left   . COPD (chronic obstructive pulmonary disease)   . Neuropathy due to secondary diabetes mellitus 02/04/2014  . Gallstones 02/04/2014  . PNA (pneumonia) 01/19/2014  . COPD (chronic obstructive pulmonary disease)   . Injury of kidney 01/19/2014  . GERD (gastroesophageal reflux disease)   . H/O hiatal hernia   . Hypertension   . Shortness of breath   . Arthritis     Past Surgical History  Procedure Laterality Date  . Thoracentesis Left   . Bronchoscopy      [facility]OBUNSUY[facility]  . Abdominal hysterectomy      partial  . Cesarean section    . Breast surgery      lumps  . Cesarean section      x 2   history of C-section x2 Bilateral breast tumors biopsied benign Hysterectomy  Family history: Patient's mother is alive with COPD and diabetes father is deceased of heart disease and history of stroke she has a sister and brother who are alive both with diabetes and COPD  History  Social History  . Marital Status: Married    Spouse Name: N/A    Number of Children: 3  . Years of Education: N/A   Occupational History  . Not on file.   Social History Main Topics  . Smoking status: Former Smoker -- 2.00 packs/day for 39 years    Types: Cigarettes    Start date: 02/03/1969    Quit date: 02/04/2008  . Smokeless tobacco: Current User    Types: Snuff     Comment: POUCH  . Alcohol Use: No  . Drug Use: No  . Sexual Activity: Not on file   Other Topics Concern  . Not on file   Social History Narrative  . No narrative on file    History  Smoking status  . Former Smoker -- 2.00 packs/day for 39 years  . Types: Cigarettes  . Start date: 02/03/1969  . Quit date: 02/04/2008  Smokeless tobacco  . Current User  . Types: Snuff    Comment: POUCH    History  Alcohol Use No     No Known Allergies  Current Facility-Administered Medications  Medication Dose Route Frequency Provider Last Rate Last Dose  . 0.9 % irrigation (POUR BTL)    PRN  Delight OvensEdward B Kairee Kozma, MD   3,000 mL at 02/12/14 1419  . cefUROXime (ZINACEF) 1.5 g in dextrose 5 % 50 mL IVPB  1.5 g Intravenous 60 min Pre-Op Delight OvensEdward B Posey Petrik, MD      . lactated ringers infusion   Intravenous Continuous Arta BruceKevin Ossey, MD 10 mL/hr at 02/12/14 1146       Review of Systems:     Cardiac Review of Systems: Y or N  Chest Pain [ n   ]  Resting SOB [ y  ] Exertional SOB  Cove.Etienne[y  ]  Orthopnea [ n ]   Pedal Edema [  n ]    Palpitations [ n ] Syncope  [ n ]   Presyncope [  n ]  General Review of Systems: [Y] = yes [  ]=no Constitional: recent weight change [n  ];  Wt loss over the last 3 months [   ] anorexia [  ]; fatigue Cove.Etienne[y  ]; nausea [  ]; night sweats [  ]; fever [  ]; or chills [  ];          Dental: poor dentition[y  ]; Last Dentist visit:   Eye : blurred vision Cove.Etienne[y  ]; diplopia [   ]; vision changes [  ];  Amaurosis fugax[  ]; Resp: cough [  ];  wheezing[  ];  hemoptysis[  ]; shortness of breath[  ]; paroxysmal nocturnal dyspnea[  ]; dyspnea on exertion[  ]; or orthopnea[  ];  GI:  gallstones[  ], vomiting[  ];  dysphagia[  ]; melena[  ];  hematochezia [  ]; heartburn[ y ];   Hx of  Colonoscopy[y  ]; GU: kidney stones [  ]; hematuria[  ];   dysuria [ y ];  nocturia[  ];  history of     obstruction [  ]; urinary frequency [  ]             Skin: rash, swelling[  ];, hair loss[  ];  peripheral edema[  ];  or itching[  ]; Musculosketetal: myalgias[  ];  joint swelling[  ];  joint erythema[  ];  joint pain[  ];  back pain[  ];  Heme/Lymph: bruising[  ];  bleeding[  ];  anemia[  ];  Neuro: TIA[  ];  headaches[ y ];  stroke[  ];  vertigo[  ];  seizures[  ];   paresthesias[  ];  difficulty walking[ y ];  Psych:depression[y  ]; anxiety[ y ];  Endocrine: diabetes[ y ];  thyroid dysfunction[  ];  Immunizations: Flu up to date Cove.Etienne  ]; Pneumococcal up to date [ y ];  Other:  Physical Exam: BP 137/67  Pulse 88  Temp(Src) 97.1 F (36.2 C) (Oral)  Resp 20  Ht 5\' 6"  (1.676 m)  Wt 214 lb 8 oz  (97.297 kg)  BMI 34.64 kg/m2  SpO2 98%  PHYSICAL EXAMINATION:  General appearance: alert, cooperative, appears older than stated age, fatigued and no distress Neurologic: intact Heart: regular rate and rhythm, S1, S2 normal, no murmur, click, rub or gallop Lungs: diminished breath sounds LLL and LUL Abdomen: soft, non-tender; bowel sounds normal; no masses,  no organomegaly Extremities: extremities normal, atraumatic, no cyanosis or edema and Homans sign is negative, no sign of DVT Patient has no carotid bruits, has 1+ DP and PT bilaterally He's on 2 L of oxygen O2 sats 90%  Diagnostic Studies & Laboratory data:     Recent Radiology Findings:  Chest 2 View  02/11/2014   CLINICAL DATA:  Empyema drainage ; difficulty breathing  EXAM: CHEST  2 VIEW  COMPARISON:  Chest CT and chest radiograph February 03, 2014  FINDINGS: The previously noted loculated effusion on the left is somewhat smaller. Fluid is primarily located posteriorly and laterally on the left. There is patchy atelectasis on the left.  Right lung is clear. Heart is upper normal in size with pulmonary vascularity within normal limits. No adenopathy. There is a hiatal hernia present. There is degenerative change in the thoracic spine.  IMPRESSION: Loculated effusion on the left smaller. Atelectasis is seen in the left mid lower lung zones. Right lung clear. No change in cardiac silhouette. Hiatal hernia present.   Electronically Signed   By: Bretta Bang M.D.   On: 02/11/2014 14:30  02/03/2014  <HTML><META HTTP-EQUIV="content-type" CONTENT="text/html;charset=utf-8"><P>CLINICAL DATA: Pleural effusion<BR> <BR>EXAM:<BR>CT CHEST WITHOUT CONTRAST<BR> <BR>TECHNIQUE:<BR>Multidetector CT imaging of the chest was performed following the<BR>standard protocol without IV contrast..<BR> <BR>COMPARISON: Radiograph 820 is crash that radiograph 02/03/2014<BR> <BR>FINDINGS:<BR>There is a partially loculated left pleural effusion. There is a<BR>small  pocket of gas within the effusion measuring 19 mm (image 40,<BR>series 3) which may relate to thoracentesis. No pneumothorax is<BR>present. There is paraseptal emphysema in the left and right lungs.<BR>There is small amount of subcutaneous gas in the posterior left back<BR>tissue also presumably related to the thoracentesis.<BR> <BR>There is significant atelectasis in the left lower lobe with<BR>thickened bands of atelectatic pleural parenchyma. There is nodular<BR>linear thickening within the right middle lobe measuring 9 mm.<BR>Similar nodular peripheral pleural density in the superior segment<BR>left lower lobe measuring 10 mm (image 24, series 3.<BR> <BR>Limited view of the upper abdomen demonstrates gallstones in the<BR>gallbladder. Adrenal glands normal. Review of the skeleton<BR>demonstrates no aggressive osseous lesion.<BR> <BR>IMPRESSION:<BR>1. Loculated left pleural effusion. No evidence of pneumothorax.<BR>2. Linear parenchymal bands in the left lower lobe with atelectasis.<BR>3. Several foci of nodular pleural thickening are likely post<BR>infectious or inflammatory. Recommend follow-up CT without contrast<BR>in 6 to 8 weeks to re-evaluate.<BR> <BR> <BR>Electronically Signed<BR>By: Genevive Bi M.D.<BR>On: 02/03/2014 12:58<BR></P> 01/15/2014: IMPRESSION:  1. Left basilar consolidation with cystic changes concerning for necrosis. Evaluation is limited without contrast. There is a small to moderate left effusion. 2. Hepatomegaly. 3. Cholelithiasis. 4. Hiatal hernia. 5. Enlargement of the central pulmonary arteries suggesting  pulmonary tear hypertension.. In semen.   Result Narrative  TECHNIQUE: Axial CT chest abdomen and pelvis without contrast.  COMPARISON:  None  FINDINGS:  CHEST: No mediastinal adenopathy. Normal heart size. Limited evaluation of vascular structures due to absence of contrast. There is enlargement central pulmonary arteries suggesting pulmonary arterial hypertension.  Coronary artery calcifications. Left pleural  effusion. Left lower lobe consolidation with cystic areas or bronchiectasis. Emphysema. Limited evaluation due to respiratory motion. Central airways are patent.  ABDOMEN AND PELVIS: . Liver: Hepatomegaly. . Gallbladder and Bile ducts: Cholelithiasis. . Pancreas: Within normal limits. . Spleen: Within normal limits. . Adrenals: Within normal limits. . . GI tract: Hiatal hernia. Fecal retention. No obstruction. Marland Kitchen Appendix: Appendectomy. . . Kidneys: Within normal limits.  . Ureters: Within normal limits. . Urinary bladder: Within normal limits. . Reproductive: Hysterectomy. . . Vascular: Aortic and iliac atherosclerosis. Ectasia of the infrarenal abdominal aorta. . . Peritoneum/Extraperitoneum: No free fluid or free air. . . Musculoskeletal: No acute osseous abnormality.     Bronchoscopy culture 01/23/14:[facility] results Culture, Quantitative Respiratory - Final result (01/23/2014 9:15 AM EDT)  Culture, Quantitative Respiratory - Final result (01/23/2014 9:15 AM EDT)  Component Value Range  Final Report 1,000 cfu/ml Yeast  >100,000 cfu/ml Streptococcus viridans group  Gram stain reviewed and found to be correct.  The organisms seen on gram stain but not isolated may be due to antibiotic therapy, nonviable organisms, or anaerobes.   GRAM STAIN Few White Blood Cells  Few epithelial cells  Few Gram Negative Rods  Few Gram positive cocci in pairs and clusters   Culture Status POS (A)    Recent discharge from [facility]: Hospital Course:  For full details, please see H&P and Progress Notes. Briefly, Judy Reeves is a 58 y.o. female with oxygen-dependent COPD who was sent from [facility] for management of pneumonia. She presented to the emergency room at [facility] where she was diagnosed with a left basilar pneumonia. A chest CT showed a left basilar consolidation with possible necrosis. It was felt patient would benefit from  pulmonary consult and possibly bronchoscopy. She was sent to [facility]. The patient was admitted to the [facility] internal medicine service. The remainder of the discharge summary will be presented in problem based format.  1. Sepsis due to pneumonia: the patient presented with dyspnea, shortness of breath, and fevers. A CT of the chest at [facility] showed left basilar consolidation with cystic changes concerning for necrosis. Pulmonology was consulted and recommended empiric antibiotics for total of 14 days. She will complete a 14 day course of Levaquin on August 1. Bronchoscopy was performed on 7/23 with BAL and cytology pending. The patient was treated with inhalers, chest PT, and breathing treatments. She'll follow-up with Dr. Eulis Foster (establish with new pulmonologist) on July 31 at [facility].  2. Acute on chronic respiratory failure: The patient was found to be in acute on chronic respiratory failure secondary to pneumonia and prior COPD. She was treated with chest PT, nebs, inhalers per RT as above. Her supplemental oxygen was weaned back to her home dose of 2 L.  3. AKI: patient was noted to have an acute kidney injury with a creatinine of 1.81 on admission. This resolved spontaneously and her creatinine was 0.5 on the day of discharge.  4. DM type 2: hemoglobin A1c on admission was 6.6%. The patient was started on metformin 500 mg twice a day and she'll be discharged with this medication. She can follow-up with her primary care doctor.  Recent Lab Findings: Lab Results  Component Value Date   WBC 6.4 02/11/2014      Assessment / Plan:   Patient is a 58 year old diabetic female with long-standing COPD oxygen dependent x10 years, recently treated at [facility] for left lower lobe pneumonia now presents with left empyema, attempted thoracentesis was unsuccessful the patient has been referred for decortication by pulmonary service. I discussed with the patient and her husband  in detail and showed them the recent CT scan. I've recommended that we proceed tomorrow with left video-assisted thoracoscopy bronchoscopy possible left thoracotomy with decortication and drainage of empyema. The patient and her husband do not wish to proceed tomorrow but are agreeable to have surgery next week.   The patient has continued to take her antihypertensive medications after discharge from [facility],  With her blood pressure low currently and  by her history while in the hospital  we will hold any further antihypertensive medication.  The natural history of left empyema is reviewed with the patient , the surgical procedure and risks of proceeding and risks of untreated empyema has been discussed .    In addition to empyema patient also has diabetes,Neuropathy related to diabetes, severe COPD oxygen dependent, gallstones, renal insufficiency associated with her recent admission for pneumonia/sepsis   The goals risks and alternatives of the planned surgical procedure bronchoscopy, left VATS drainage empyema The risks of the procedure including death, infection, stroke, myocardial infarction, bleeding, blood transfusion have all been discussed specifically.  I have quoted Judy Reeves a 5% of perioperative mortality and a complication rate as high as 20 %. The patient's questions have been answered.Idelia Caudell Sarff is willing  to proceed with the planned procedure.  Delight Ovens MD      [address] [place] [place] [address] Office [phone]   Beeper [phone]  02/12/2014 3:03 PM

## 2014-02-12 NOTE — Anesthesia Postprocedure Evaluation (Signed)
  Anesthesia Post-op Note  Patient: Judy Reeves  Procedure(s) Performed: Procedure(s): VIDEO BRONCHOSCOPY (N/A) VIDEO ASSISTED THORACOSCOPY / DECORTICATION (Left) EMPYEMA DRAINAGE (Left)  Patient Location: PACU  Anesthesia Type:General  Level of Consciousness: awake, alert  and oriented  Airway and Oxygen Therapy: Patient Spontanous Breathing and Patient connected to nasal cannula oxygen  Post-op Pain: mild  Post-op Assessment: Post-op Vital signs reviewed, Patient's Cardiovascular Status Stable, Respiratory Function Stable, Patent Airway and No signs of Nausea or vomiting  Post-op Vital Signs: stable  Last Vitals:  Filed Vitals:   02/12/14 1930  BP: 121/57  Pulse: 74  Temp: 36.6 C  Resp: 18    Complications: No apparent anesthesia complications

## 2014-02-12 NOTE — Progress Notes (Signed)
ANTIBIOTIC CONSULT NOTE - INITIAL  Pharmacy Consult for Vancomycin, Rocephin Indication: pneumonia  No Known Allergies  Patient Measurements: Height: 5\' 6"  (167.6 cm) Weight: 214 lb 8 oz (97.297 kg) IBW/kg (Calculated) : 59.3   Labs:  Recent Labs  02/11/14 1222  WBC 6.4  HGB 10.0*  PLT 253  CREATININE 0.65    Microbiology: Recent Results (from the past 720 hour(s))  SURGICAL PCR SCREEN     Status: None   Collection Time    02/11/14 12:21 PM      Result Value Ref Range Status   MRSA, PCR NEGATIVE  NEGATIVE Final   Staphylococcus aureus NEGATIVE  NEGATIVE Final   Comment:            The Xpert SA Assay (FDA     approved for NASAL specimens     in patients over 59 years of age),     is one component of     a comprehensive surveillance     program.  Test performance has     been validated by [facility] for patients greater     than or equal to 735 year old.     It is not intended     to diagnose infection nor to     guide or monitor treatment.    Medical History: Past Medical History  Diagnosis Date  . Pleural effusion on left   . COPD (chronic obstructive pulmonary disease)   . Neuropathy due to secondary diabetes mellitus 02/04/2014  . Gallstones 02/04/2014  . PNA (pneumonia) 01/19/2014  . COPD (chronic obstructive pulmonary disease)   . Injury of kidney 01/19/2014  . GERD (gastroesophageal reflux disease)   . H/O hiatal hernia   . Hypertension   . Shortness of breath   . Arthritis     Assessment: 58 year old female who is s/p VATS now starting Rocephin and Vancomycin CrCl=912m ml/min  Goal of Therapy:  Vancomycin trough level 15-20 mcg/ml Appropriate Rocephin dosing  Plan:  1) Rocephin 1 Gram IV Q 24 hours 2) Vancomycin 1500 mg IV Q 12 hours 3) Follow up Scr, cultures, progress  Thank you. Okey RegalLisa Shaneece Stockburger, PharmD [phone]  Elwin SleightPowell, Shantanu Strauch Kay 02/12/2014,10:38 PM

## 2014-02-12 NOTE — Anesthesia Preprocedure Evaluation (Signed)
Anesthesia Evaluation  Patient identified by MRN, date of birth, ID band Patient awake    Reviewed: Allergy & Precautions, H&P , NPO status   Airway Mallampati: II TM Distance: >3 FB     Dental  (+) Teeth Intact, Dental Advisory Given   Pulmonary former smoker,    + decreased breath sounds      Cardiovascular hypertension, Rhythm:Regular Rate:Normal     Neuro/Psych    GI/Hepatic   Endo/Other  diabetes  Renal/GU      Musculoskeletal   Abdominal   Peds  Hematology   Anesthesia Other Findings   Reproductive/Obstetrics                           Anesthesia Physical Anesthesia Plan  ASA: III  Anesthesia Plan: General   Post-op Pain Management:    Induction: Intravenous  Airway Management Planned: Oral ETT and Double Lumen EBT  Additional Equipment: Arterial line and CVP  Intra-op Plan:   Post-operative Plan: Extubation in OR  Informed Consent: I have reviewed the patients History and Physical, chart, labs and discussed the procedure including the risks, benefits and alternatives for the proposed anesthesia with the patient or authorized representative who has indicated his/her understanding and acceptance.   Dental advisory given  Plan Discussed with: CRNA and Anesthesiologist  Anesthesia Plan Comments:         Anesthesia Quick Evaluation

## 2014-02-12 NOTE — Transfer of Care (Signed)
Immediate Anesthesia Transfer of Care Note  Patient: Judy Reeves  Procedure(s) Performed: Procedure(s): VIDEO BRONCHOSCOPY (N/A) VIDEO ASSISTED THORACOSCOPY / DECORTICATION (Left) EMPYEMA DRAINAGE (Left)  Patient Location: PACU  Anesthesia Type:General  Level of Consciousness: awake, alert  and oriented  Airway & Oxygen Therapy: Patient Spontanous Breathing and Patient connected to face mask oxygen  Post-op Assessment: Report given to PACU RN and Post -op Vital signs reviewed and stable  Post vital signs: Reviewed and stable  Complications: No apparent anesthesia complications

## 2014-02-12 NOTE — Consult Note (Signed)
PULMONARY / CRITICAL CARE MEDICINE   Name: Judy Reeves MRN: [id number] DOB: [birth year]    ADMISSION DATE:  02/12/2014 CONSULTATION DATE:  02/12/2014  REFERRING MD :  Ofilia Neas  CHIEF COMPLAINT:  Short of breath  INITIAL PRESENTATION:  58 yo female former smoker had hospitalization at [facility] in July 2015 for LLL Streptococcal PNA complicated by complex Lt pleural effusion.  She had VATS decortication on 8/12.  PCCM consulted to assist with pulmonary management in ICU.  STUDIES:   SIGNIFICANT EVENTS: 8/12 Admit, Lt VATS decortication, Extubated in PACU   HISTORY OF PRESENT ILLNESS:   58 yo female was in hospital at [facility] in July with LLL PNA.  She had bronchoscopy and culture grew Streptococcal species.  She was treated with Abx.  She followed up with her pulmonologist, Dr. Eulis Foster in [facility].  She was found to have a moderate Lt effusion.  Thoracentesis was attempted, but unable to draw any fluid.  She was then referred to Dr. Tyrone Sage for evaluation of complex Lt pleural effusion.  Decision was that she would need Lt VATS with decortication.  This was performed without significant events, she was extubated in PACU, and transferred to ICU.  She has hx of COPD on home oxygen, DM with neuropathy and CKD.  PAST MEDICAL HISTORY :  Past Medical History  Diagnosis Date  . Pleural effusion on left   . COPD (chronic obstructive pulmonary disease)   . Neuropathy due to secondary diabetes mellitus 02/04/2014  . Gallstones 02/04/2014  . PNA (pneumonia) 01/19/2014  . COPD (chronic obstructive pulmonary disease)   . Injury of kidney 01/19/2014  . GERD (gastroesophageal reflux disease)   . H/O hiatal hernia   . Hypertension   . Shortness of breath   . Arthritis    Past Surgical History  Procedure Laterality Date  . Thoracentesis Left   . Bronchoscopy      DR.GOBUNSUY...[facility]  . Abdominal hysterectomy      partial  . Cesarean section    . Breast surgery       lumps  . Cesarean section      x 2   Prior to Admission medications   Medication Sig Start Date End Date Taking? Authorizing Provider  aspirin EC 81 MG tablet Take 81 mg by mouth daily.   Yes Historical Provider, MD  budesonide-formoterol (SYMBICORT) 160-4.5 MCG/ACT inhaler Inhale 1-2 puffs into the lungs 2 (two) times daily.   Yes Historical Provider, MD  gabapentin (NEURONTIN) 600 MG tablet Take 600 mg by mouth 3 (three) times daily.   Yes Historical Provider, MD  linagliptin (TRADJENTA) 5 MG TABS tablet Take 5 mg by mouth daily.   Yes Historical Provider, MD  Linagliptin-Metformin HCl (JENTADUETO) 2.11-998 MG TABS Take 1 tablet by mouth daily.   Yes Historical Provider, MD  magnesium oxide (MAG-OX) 400 MG tablet Take 400 mg by mouth daily.   Yes Historical Provider, MD  omeprazole (PRILOSEC OTC) 20 MG tablet Take 20 mg by mouth daily.   Yes Historical Provider, MD  pravastatin (PRAVACHOL) 20 MG tablet Take 20 mg by mouth daily.   Yes Historical Provider, MD   No Known Allergies  FAMILY HISTORY:  History reviewed. No pertinent family history. SOCIAL HISTORY:  reports that she quit smoking about 6 years ago. Her smoking use included Cigarettes. She started smoking about 45 years ago. She has a 78 pack-year smoking history. Her smokeless tobacco use includes Snuff. She reports that she does not drink  alcohol or use illicit drugs.  REVIEW OF SYSTEMS:   All negative; except for those that are bolded, which indicate positives.  Constitutional: weight loss, weight gain, night sweats, fevers, chills, fatigue, weakness.  HEENT: headaches, sore throat, sneezing, nasal congestion, post nasal drip, difficulty swallowing, tooth/dental problems, visual complaints, visual changes, ear aches. Neuro: difficulty with speech, weakness, numbness, ataxia. CV:  chest pain, orthopnea, PND, swelling in lower extremities, dizziness, palpitations, syncope.  Resp: cough, hemoptysis, dyspnea, wheezing. GI   heartburn, indigestion, abdominal pain, nausea, vomiting, diarrhea, constipation, change in bowel habits, loss of appetite, hematemesis, melena, hematochezia.  GU: dysuria, change in color of urine, urgency or frequency, flank pain, hematuria. MSK: joint pain or swelling, decreased range of motion. Psych: change in mood or affect, depression, anxiety, suicidal ideations, homicidal ideations. Skin: rash, itching, bruising.   SUBJECTIVE:   VITAL SIGNS: Temp:  [97.1 F (36.2 C)-98 F (36.7 C)] 97.9 F (36.6 C) (08/12 1930) Pulse Rate:  [68-91] 74 (08/12 1930) Resp:  [16-22] 18 (08/12 1930) BP: (114-137)/(53-67) 121/57 mmHg (08/12 1930) SpO2:  [90 %-98 %] 91 % (08/12 1930) Arterial Line BP: (126-148)/(67-69) 126/67 mmHg (08/12 1810) Weight:  [97.297 kg (214 lb 8 oz)] 97.297 kg (214 lb 8 oz) (08/12 1139) HEMODYNAMICS:   VENTILATOR SETTINGS:   INTAKE / OUTPUT:  Intake/Output Summary (Last 24 hours) at 02/12/14 2014 Last data filed at 02/12/14 1800  Gross per 24 hour  Intake   1600 ml  Output    205 ml  Net   1395 ml    PHYSICAL EXAMINATION: General: Sleepy,  in NAD. Neuro: A&O x 3, non-focal.  HEENT: /AT. PERRL, sclerae anicteric. Cardiovascular: RRR, no M/R/G.  Lungs: Respirations even and unlabored. Coarse at left base, mild expiratory wheezes. Left chest tube in place. Abdomen: Obese, BS hypoactive, soft, NT/ND.  Musculoskeletal: No gross deformities, no edema.  Skin: Intact, warm, no rashes.    LABS:  CBC  Recent Labs Lab 02/11/14 1222  WBC 6.4  HGB 10.0*  HCT 32.2*  PLT 253   Coag's  Recent Labs Lab 02/11/14 1222  APTT 24  INR 0.97   BMET  Recent Labs Lab 02/11/14 1222  NA 138  K 4.1  CL 103  CO2 21  BUN 12  CREATININE 0.65  GLUCOSE 120*   Electrolytes  Recent Labs Lab 02/11/14 1222  CALCIUM 8.7   Sepsis Markers No results found for this basename: LATICACIDVEN, PROCALCITON, O2SATVEN,  in the last 168 hours ABG  Recent  Labs Lab 02/11/14 1222  PHART 7.420  PCO2ART 37.8  PO2ART 63.4*   Liver Enzymes  Recent Labs Lab 02/11/14 1222  AST 13  ALT 7  ALKPHOS 79  BILITOT 0.3  ALBUMIN 2.9*   Cardiac Enzymes No results found for this basename: TROPONINI, PROBNP,  in the last 168 hours Glucose  Recent Labs Lab 02/12/14 1147 02/12/14 1412 02/12/14 1808  GLUCAP 103* 97 110*    Imaging Chest 2 View  02/11/2014   CLINICAL DATA:  Empyema drainage ; difficulty breathing  EXAM: CHEST  2 VIEW  COMPARISON:  Chest CT and chest radiograph February 03, 2014  FINDINGS: The previously noted loculated effusion on the left is somewhat smaller. Fluid is primarily located posteriorly and laterally on the left. There is patchy atelectasis on the left.  Right lung is clear. Heart is upper normal in size with pulmonary vascularity within normal limits. No adenopathy. There is a hiatal hernia present. There is degenerative change in the thoracic  spine.  IMPRESSION: Loculated effusion on the left smaller. Atelectasis is seen in the left mid lower lung zones. Right lung clear. No change in cardiac silhouette. Hiatal hernia present.   Electronically Signed   By: Bretta Bang M.D.   On: 02/11/2014 14:30     ASSESSMENT / PLAN:  PULMONARY Lt chest tube 8/12 >>   A: Hx of COPD. Acute on chronic hypoxic respiratory failure. S/p Lt VATS decortication 8/12. P:   Oxygen to keep SaO2 > 90% F/u CXR in am  Post-op care, chest tubes per TCTS Bronchial hygiene Symbicort, xopenex See ID for abx Has a line, low threshold abg if any worsening resp status  CARDIOVASCULAR Rt IJ CVL 8/12 >>  A:  Hx of HTN, hyperlipidemia. P:  Monitor hemodynamics Pravachol (zocor while in hospital), ASA tele  RENAL A:   CKD in setting of DM. P:   Monitor renal fx, urine outpt, electrolytes Allow pos balance pcxr in am   GASTROINTESTINAL A:   Nutrition. P:   Per primary team Protonix for SUP  HEMATOLOGIC A:   Anemia of  chronic disease. 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   P:   SSI Tradjenta, Jentadueto for now  NEUROLOGIC A:   Post-op pain control. DM neuropathy. P:   Post-op pain control per TCTS Neurontin  TODAY'S SUMMARY: 58 y.o. F with recent hospitalization in July at [facility] for LLL streptococcal PNA complicated by left pleural effusion.  Referred to [facility] for complex effusion and empyema.  Underwent VATS with decortication 8/12.  Started on empiric abx for presumed recurrent strep.  Pleural fluid cultures pending.  Rutherford Guys, PA - [facility] Pgr: [phone]  or [phone]    I have personally obtained a history, examined the patient, evaluated laboratory and imaging results, formulated the assessment and plan and placed orders.  Mcarthur Rossetti. Tyson Alias, MD, FACP Pgr: [phone] [place] [facility]  Pulmonary and Critical Care Medicine [facility] Pager: [phone]  02/12/2014, 8:14 PM

## 2014-02-13 ENCOUNTER — Inpatient Hospital Stay (HOSPITAL_COMMUNITY): Payer: Medicare Other

## 2014-02-13 ENCOUNTER — Encounter (HOSPITAL_COMMUNITY): Payer: Self-pay | Admitting: Cardiothoracic Surgery

## 2014-02-13 DIAGNOSIS — J438 Other emphysema: Secondary | ICD-10-CM

## 2014-02-13 LAB — BASIC METABOLIC PANEL
Anion gap: 10 (ref 5–15)
BUN: 11 mg/dL (ref 6–23)
CO2: 26 meq/L (ref 19–32)
CREATININE: 0.61 mg/dL (ref 0.50–1.10)
Calcium: 8.2 mg/dL — ABNORMAL LOW (ref 8.4–10.5)
Chloride: 105 mEq/L (ref 96–112)
GFR calc Af Amer: >90 mL/min (ref >90)
Glucose, Bld: 122 mg/dL — ABNORMAL HIGH (ref 70–99)
Potassium: 4.5 mEq/L (ref 3.7–5.3)
Sodium: 141 mEq/L (ref 137–147)

## 2014-02-13 LAB — POCT I-STAT 3, ART BLOOD GAS (G3+)
Acid-Base Excess: 1 mmol/L (ref 0.0–2.0)
Bicarbonate: 25.8 mEq/L — ABNORMAL HIGH (ref 20.0–24.0)
O2 Saturation: 93 %
Patient temperature: 98.3
TCO2: 27 mmol/L (ref 0–100)
pCO2 arterial: 41.1 mmHg (ref 35.0–45.0)
pH, Arterial: 7.405 (ref 7.350–7.450)
pO2, Arterial: 68 mmHg — ABNORMAL LOW (ref 80.0–100.0)

## 2014-02-13 LAB — CBC
HCT: 26.2 % — ABNORMAL LOW (ref 36.0–46.0)
Hemoglobin: 8.3 g/dL — ABNORMAL LOW (ref 12.0–15.0)
MCH: 26.6 pg (ref 26.0–34.0)
MCHC: 31.7 g/dL (ref 30.0–36.0)
MCV: 84 fL (ref 78.0–100.0)
PLATELETS: 209 10*3/uL (ref 150–400)
RBC: 3.12 MIL/uL — AB (ref 3.87–5.11)
RDW: 15.8 % — ABNORMAL HIGH (ref 11.5–15.5)
WBC: 7 10*3/uL (ref 4.0–10.5)

## 2014-02-13 LAB — GLUCOSE, CAPILLARY
Glucose-Capillary: 127 mg/dL — ABNORMAL HIGH (ref 70–99)
Glucose-Capillary: 118 mg/dL — ABNORMAL HIGH (ref 70–99)
Glucose-Capillary: 116 mg/dL — ABNORMAL HIGH (ref 70–99)
Glucose-Capillary: 111 mg/dL — ABNORMAL HIGH (ref 70–99)

## 2014-02-13 MED ORDER — LUNG SURGERY BOOK
Freq: Once | Status: DC
  Filled 2014-02-13: qty 1

## 2014-02-13 MED ORDER — FUROSEMIDE 10 MG/ML IJ SOLN
40.0000 mg | Freq: Once | INTRAMUSCULAR | Status: AC
  Administered 2014-02-13: 40 mg via INTRAVENOUS
  Filled 2014-02-13: qty 4

## 2014-02-13 MED ORDER — CETYLPYRIDINIUM CHLORIDE 0.05 % MT LIQD
7.0000 mL | Freq: Two times a day (BID) | OROMUCOSAL | Status: DC
  Administered 2014-02-13 – 2014-02-15 (×6): 7 mL via OROMUCOSAL

## 2014-02-13 MED ORDER — ENOXAPARIN SODIUM 30 MG/0.3ML ~~LOC~~ SOLN
30.0000 mg | SUBCUTANEOUS | Status: DC
  Administered 2014-02-14 – 2014-02-15 (×2): 30 mg via SUBCUTANEOUS
  Filled 2014-02-13 (×4): qty 0.3

## 2014-02-13 NOTE — Progress Notes (Signed)
PT Cancellation Note  Patient Details Name: Judy Reeves MRN: [id number] DOB: [birth year]   Cancelled Treatment:    Reason Eval/Treat Not Completed: Medical issues which prohibited therapy.  Pt on bedrest until 1230.  Will return and evaluate as able.  Thanks.   INGOLD,Dierks Wach 02/13/2014, 8:48 AM Audree Camelawn Ingold,PT Acute Rehabilitation [phone] (pager)

## 2014-02-13 NOTE — Evaluation (Signed)
Physical Therapy Evaluation Patient Details Name: Judy Reeves MRN: [id number] DOB: September 03, 1955 Today's Date: 02/13/2014   History of Present Illness    58 yo female former smoker [facility] July 2015 for LLL Streptococcal PNA complicated by complex Lt pleural effusion. She had VATS decortication on 8/12. PCCM consulted to assist with pulmonary management in ICU.   Clinical Impression  Pt admitted with PNA and VATS. Pt currently with functional limitations due to the deficits listed below (see PT Problem List).Should progress well and has good family support.   Pt will benefit from skilled PT to increase their independence and safety with mobility to allow discharge to the venue listed below.     Follow Up Recommendations Home health PT;Supervision/Assistance - 24 hour    Equipment Recommendations  Rolling walker with 5" wheels    Recommendations for Other Services       Precautions / Restrictions Precautions Precautions: Fall Restrictions Weight Bearing Restrictions: No      Mobility  Bed Mobility                  Transfers Overall transfer level: Needs assistance Equipment used:  (pushing wheelchair) Transfers: Sit to/from Stand Sit to Stand: Min assist;+2 safety/equipment         General transfer comment: Needed cues for hand placement and steadying assist.   Ambulation/Gait Ambulation/Gait assistance: Min assist;+2 safety/equipment Ambulation Distance (Feet): 250 Feet Assistive device:  (pushing Wheelchair) Gait Pattern/deviations: Step-through pattern;Decreased stride length   Gait velocity interpretation: Below normal speed for age/gender General Gait Details: Pt ambulated pushing wheelchair with a little assist to steer wheelchair.   Pt needed occasional cues to stop and rest to pursed lip breathe.  Took a few standing rest breaks.    Stairs            Wheelchair Mobility    Modified Rankin (Stroke Patients  Only)       Balance Overall balance assessment: Needs assistance;History of Falls Sitting-balance support: No upper extremity supported;Feet supported Sitting balance-Leahy Scale: Fair     Standing balance support: Bilateral upper extremity supported;During functional activity Standing balance-Leahy Scale: Poor Standing balance comment: Needs UE support to balance.                             Pertinent Vitals/Pain Pain Assessment: No/denies pain.  Stated she had no pain and would not rate pain but used PCA a few times during treatment.  O2 on 3.5 LO2 in room at rest with 95% O2.  With ambulation, had to incr to 6LO2 to keep sats >90%.  Decr O2 back to 3.5 L at end of tx with pt sat 95%.  Other VSS.      Home Living Family/patient expects to be discharged to:: Private residence Living Arrangements: Spouse/significant other Available Help at Discharge: Family;Available 24 hours/day Type of Home: Apartment Home Access: Stairs to enter Entrance Stairs-Rails: Can reach both;Left;Right Entrance Stairs-Number of Steps: 6 Home Layout: One level Home Equipment: Cane - single point      Prior Function Level of Independence: Independent               Hand Dominance   Dominant Hand: Right    Extremity/Trunk Assessment   Upper Extremity Assessment: Defer to OT evaluation           Lower Extremity Assessment: Generalized weakness      Cervical / Trunk Assessment: Normal  Communication   Communication: No difficulties  Cognition Arousal/Alertness: Awake/alert Behavior During Therapy: WFL for tasks assessed/performed Overall Cognitive Status: Within Functional Limits for tasks assessed                      General Comments      Exercises        Assessment/Plan    PT Assessment Patient needs continued PT services  PT Diagnosis Acute pain;Generalized weakness   PT Problem List Decreased mobility;Decreased balance;Decreased activity  tolerance;Decreased strength;Decreased knowledge of use of DME;Decreased safety awareness;Decreased knowledge of precautions;Pain  PT Treatment Interventions DME instruction;Gait training;Functional mobility training;Therapeutic activities;Therapeutic exercise;Balance training;Patient/family education;Stair training   PT Goals (Current goals can be found in the Care Plan section) Acute Rehab PT Goals Patient Stated Goal: to go home PT Goal Formulation: With patient Time For Goal Achievement: 02/20/14 Potential to Achieve Goals: Good    Frequency Min 3X/week   Barriers to discharge        Co-evaluation               End of Session Equipment Utilized During Treatment: Gait belt;Oxygen Activity Tolerance: Patient limited by fatigue Patient left: in chair;with call bell/phone within reach;with family/visitor present Nurse Communication: Mobility status         Time: 9147-8295 PT Time Calculation (min): 21 min   Charges:   PT Evaluation $Initial PT Evaluation Tier I: 1 Procedure PT Treatments $Gait Training: 8-22 mins   PT G Codes:          INGOLD,Trevor Wilkie 02/26/2014, 2:25 PM [facility] Acute Rehabilitation [phone] (pager)

## 2014-02-13 NOTE — Progress Notes (Signed)
CT surgery p.m. Rounds  Patient resting comfortably after VATS for empyema Minimal chest tube drainage Patient afebrile saturation 96% Maintaining adequate urine Stable day

## 2014-02-13 NOTE — Consult Note (Signed)
PULMONARY / CRITICAL CARE MEDICINE   Name: Judy Reeves MRN: [id number] DOB: 1955-12-08    ADMISSION DATE:  02/12/2014 CONSULTATION DATE:  02/12/2014  REFERRING MD :  Ofilia Neas  CHIEF COMPLAINT:  Short of breath  INITIAL PRESENTATION:  58 yo female former smoker had hospitalization at [facility] in July 2015 for LLL Streptococcal PNA complicated by complex Lt pleural effusion.  She had VATS decortication on 8/12.  PCCM consulted to assist with pulmonary management in ICU.  STUDIES:   SIGNIFICANT EVENTS: 8/12 Admit, Lt VATS decortication, Extubated in PACU  SUBJECTIVE: No event overnight.  Extubated and doing well.  VITAL SIGNS: Temp:  [97.1 F (36.2 C)-98.3 F (36.8 C)] 97.8 F (36.6 C) (08/13 0808) Pulse Rate:  [56-91] 63 (08/13 0900) Resp:  [15-22] 16 (08/13 0900) BP: (92-137)/(28-67) 110/54 mmHg (08/13 0900) SpO2:  [90 %-98 %] 96 % (08/13 0900) Arterial Line BP: (126-159)/(62-87) 158/80 mmHg (08/13 0800) Weight:  [214 lb 8 oz (97.297 kg)] 214 lb 8 oz (97.297 kg) (08/12 1139) HEMODYNAMICS:   VENTILATOR SETTINGS:   INTAKE / OUTPUT:  Intake/Output Summary (Last 24 hours) at 02/13/14 1002 Last data filed at 02/13/14 0900  Gross per 24 hour  Intake   3213 ml  Output    865 ml  Net   2348 ml    PHYSICAL EXAMINATION: General: Sleepy,  in NAD. Neuro: A&O x 3, non-focal.  HEENT: Agar/AT. PERRL, sclerae anicteric. Cardiovascular: RRR, no M/R/G.  Lungs: Respirations even and unlabored. Coarse at left base, mild expiratory wheezes. Left chest tube in place. Abdomen: Obese, BS hypoactive, soft, NT/ND.  Musculoskeletal: No gross deformities, no edema.  Skin: Intact, warm, no rashes.  LABS:  CBC  Recent Labs Lab 02/11/14 1222 02/13/14 0400  WBC 6.4 7.0  HGB 10.0* 8.3*  HCT 32.2* 26.2*  PLT 253 209   Coag's  Recent Labs Lab 02/11/14 1222  APTT 24  INR 0.97   BMET  Recent Labs Lab 02/11/14 1222 02/13/14 0400  NA 138 141  K 4.1 4.5  CL 103  105  CO2 21 26  BUN 12 11  CREATININE 0.65 0.61  GLUCOSE 120* 122*   Electrolytes  Recent Labs Lab 02/11/14 1222 02/13/14 0400  CALCIUM 8.7 8.2*   Sepsis Markers No results found for this basename: LATICACIDVEN, PROCALCITON, O2SATVEN,  in the last 168 hours ABG  Recent Labs Lab 02/11/14 1222 02/12/14 2109 02/13/14 0410  PHART 7.420 7.385 7.405  PCO2ART 37.8 45.0 41.1  PO2ART 63.4* 73.0* 68.0*   Liver Enzymes  Recent Labs Lab 02/11/14 1222  AST 13  ALT 7  ALKPHOS 79  BILITOT 0.3  ALBUMIN 2.9*   Cardiac Enzymes No results found for this basename: TROPONINI, PROBNP,  in the last 168 hours Glucose  Recent Labs Lab 02/12/14 1147 02/12/14 1412 02/12/14 1808 02/12/14 2208  GLUCAP 103* 97 110* 123*   Imaging Dg Chest Port 1 View  02/12/2014   CLINICAL DATA:  Left empyema. Status post video-assisted thoracoscopy and decortication and video bronchoscopy.  EXAM: PORTABLE CHEST - 1 VIEW  COMPARISON:  Chest x-ray dated 02/11/2014 and chest CT dated 02/03/2014  FINDINGS: Left chest tube in place. Central venous catheter in good position. No pneumothoraces. Pulmonary vascularity is normal. Slight right base atelectasis. Residual pleural thickening and atelectasis at the left base.  IMPRESSION: No pneumothorax. Residual pleural thickening and atelectasis at the left base.   Electronically Signed   By: Geanie Cooley M.D.   On: 02/12/2014 19:47  ASSESSMENT / PLAN:  PULMONARY Lt chest tube 8/12 >>   A: Hx of COPD. Acute on chronic hypoxic respiratory failure. S/p Lt VATS decortication 8/12. P:   - Oxygen to keep SaO2 > 90% - F/u CXR in am  - Post-op care, chest tubes per TCTS - Bronchial hygiene - Symbicort, xopenex - See ID for abx  CARDIOVASCULAR Rt IJ CVL 8/12 >>  A:  Hx of HTN, hyperlipidemia. P:  - Monitor hemodynamics. - Pravachol (zocor while in hospital), ASA. - Tele monitoring.  RENAL A:   CKD in setting of DM. P:   - Monitor renal fx, urine  outpt, electrolytes. - Allow pos balance. - pCXR in am.  GASTROINTESTINAL A:   Nutrition. P:   - Per primary team - Protonix for SUP  HEMATOLOGIC A:   Anemia of chronic disease. 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   P:   - SSI - Tradjenta, Jentadueto for now  NEUROLOGIC A:   Post-op pain control. DM neuropathy. P:   - Post-op pain control per TCTS - Neurontin  TODAY'S SUMMARY: 58 year old female s/p decortication for empyema, continue abx for now, ISS as ordered, diet and mobilization.  I have personally obtained a history, examined the patient, evaluated laboratory and imaging results, formulated the assessment and plan and placed orders.  Alyson ReedyWesam G. Yacoub, M.D. [facility] Pulmonary/Critical Care Medicine. Pager: [phone]. After hours pager: [phone].  02/13/2014, 10:02 AM

## 2014-02-13 NOTE — Progress Notes (Signed)
Utilization review completed. Blaize Nipper, RN, BSN. 

## 2014-02-13 NOTE — Progress Notes (Signed)
Patient ID: Judy Reeves, female   DOB: Aug 28, 1955, 58 y.o.   MRN: [id number] TCTS DAILY ICU PROGRESS NOTE                   [address]            [place] [id number]          [phone]   1 Day Post-Op Procedure(s) (LRB): VIDEO BRONCHOSCOPY (N/A) VIDEO ASSISTED THORACOSCOPY / DECORTICATION (Left) EMPYEMA DRAINAGE (Left)  Total Length of Stay:  LOS: 1 day   Subjective: Awake and alert , good resp effort  Objective: Vital signs in last 24 hours: Temp:  [97.1 F (36.2 C)-98.3 F (36.8 C)] 98.3 F (36.8 C) (08/13 0347) Pulse Rate:  [56-91] 73 (08/13 0800) Cardiac Rhythm:  [-] Normal sinus rhythm (08/13 0732) Resp:  [15-22] 17 (08/13 0800) BP: (92-137)/(28-67) 117/46 mmHg (08/13 0800) SpO2:  [90 %-98 %] 97 % (08/13 0808) Arterial Line BP: (126-159)/(62-87) 158/80 mmHg (08/13 0800) Weight:  [214 lb 8 oz (97.297 kg)] 214 lb 8 oz (97.297 kg) (08/12 1139)  Filed Weights   02/12/14 1139  Weight: 214 lb 8 oz (97.297 kg)    Weight change:    Hemodynamic parameters for last 24 hours:    Intake/Output from previous day: 08/12 0701 - 08/13 0700 In: 3013 [I.V.:2463; IV Piggyback:550] Out: 775 [Urine:700; Blood:30; Chest Tube:45]  Intake/Output this shift: Total I/O In: 100 [I.V.:100] Out: 90 [Urine:90]  Current Meds: Scheduled Meds: . acetaminophen  1,000 mg Oral 4 times per day   Or  . acetaminophen (TYLENOL) oral liquid 160 mg/5 mL  1,000 mg Oral 4 times per day  . antiseptic oral rinse  7 mL Mouth Rinse BID  . aspirin EC  81 mg Oral Daily  . bisacodyl  10 mg Oral Daily  . budesonide-formoterol  1-2 puff Inhalation BID  . cefTRIAXone (ROCEPHIN)  IV  1 g Intravenous Q24H  . fentaNYL   Intravenous 6 times per day  . gabapentin  600 mg Oral TID  . insulin aspart  0-24 Units Subcutaneous TID AC & HS  . levalbuterol  0.63 mg Nebulization Q6H  . linagliptin  5 mg Oral Daily  . lung surgery book   Does not apply Once  . magnesium oxide  400  mg Oral Daily  . metFORMIN  1,000 mg Oral BID WC  . omeprazole  20 mg Oral Daily  . senna-docusate  1 tablet Oral QHS  . simvastatin  10 mg Oral q1800  . vancomycin  1,500 mg Intravenous Q12H   Continuous Infusions: . 0.45 % NaCl with KCl 20 mEq / L 100 mL/hr at 02/12/14 2124   PRN Meds:.diphenhydrAMINE, diphenhydrAMINE, naloxone, ondansetron (ZOFRAN) IV, oxyCODONE, potassium chloride, sodium chloride  General appearance: alert, cooperative and no distress Neurologic: intact Heart: regular rate and rhythm, S1, S2 normal, no murmur, click, rub or gallop Lungs: diminished breath sounds bibasilar Abdomen: soft, non-tender; bowel sounds normal; no masses,  no organomegaly Extremities: extremities normal, atraumatic, no cyanosis or edema and Homans sign is negative, no sign of DVT Wound:  no air leak, rt femoral aline in place   Lab Results: CBC: Recent Labs  02/11/14 1222 02/13/14 0400  WBC 6.4 7.0  HGB 10.0* 8.3*  HCT 32.2* 26.2*  PLT 253 209   BMET:  Recent Labs  02/11/14 1222 02/13/14 0400  NA 138 141  K 4.1 4.5  CL 103 105  CO2 21 26  GLUCOSE  120* 122*  BUN 12 11  CREATININE 0.65 0.61  CALCIUM 8.7 8.2*    PT/INR:  Recent Labs  02/11/14 1222  LABPROT 12.9  INR 0.97   Radiology: Chest 2 View  02/11/2014   CLINICAL DATA:  Empyema drainage ; difficulty breathing  EXAM: CHEST  2 VIEW  COMPARISON:  Chest CT and chest radiograph February 03, 2014  FINDINGS: The previously noted loculated effusion on the left is somewhat smaller. Fluid is primarily located posteriorly and laterally on the left. There is patchy atelectasis on the left.  Right lung is clear. Heart is upper normal in size with pulmonary vascularity within normal limits. No adenopathy. There is a hiatal hernia present. There is degenerative change in the thoracic spine.  IMPRESSION: Loculated effusion on the left smaller. Atelectasis is seen in the left mid lower lung zones. Right lung clear. No change in  cardiac silhouette. Hiatal hernia present.   Electronically Signed   By: Bretta Bang M.D.   On: 02/11/2014 14:30   Dg Chest Port 1 View  02/13/2014   CLINICAL DATA:  Empyema drainage  EXAM: PORTABLE CHEST - 1 VIEW  COMPARISON:  February 12, 2014  FINDINGS: Chest tube remains on the left. Central catheter tip is in the superior vena cava near the cavoatrial junction. No pneumothorax. There is persistent consolidation the left base with small left effusion. Right lung is clear except for mild right base atelectatic change. Heart is upper normal in size with pulmonary vascularity within normal limits. No adenopathy appreciable.  IMPRESSION: Persistent left base consolidation with small left effusion. Atelectatic change right base. No pneumothorax. No change in tube and catheter positions.   Electronically Signed   By: Bretta Bang M.D.   On: 02/13/2014 08:07   Dg Chest Port 1 View  02/12/2014   CLINICAL DATA:  Left empyema. Status post video-assisted thoracoscopy and decortication and video bronchoscopy.  EXAM: PORTABLE CHEST - 1 VIEW  COMPARISON:  Chest x-ray dated 02/11/2014 and chest CT dated 02/03/2014  FINDINGS: Left chest tube in place. Central venous catheter in good position. No pneumothoraces. Pulmonary vascularity is normal. Slight right base atelectasis. Residual pleural thickening and atelectasis at the left base.  IMPRESSION: No pneumothorax. Residual pleural thickening and atelectasis at the left base.   Electronically Signed   By: Geanie Cooley M.D.   On: 02/12/2014 19:47     Assessment/Plan: S/P Procedure(s) (LRB): VIDEO BRONCHOSCOPY (N/A) VIDEO ASSISTED THORACOSCOPY / DECORTICATION (Left) EMPYEMA DRAINAGE (Left) Mobilize Diuresis Diabetes control Continue ABX therapy due to Pre-op infection Continue foley due to strict I&O, patient in ICU and urinary output monitoring Expected Acute  Blood - loss Anemia    Lucas Winograd B 02/13/2014 8:17 AM

## 2014-02-13 NOTE — Progress Notes (Signed)
0830 Removed right femoral arterial line per protocal, pressure x 20 minutes, level 0. Instructions given to patient for keeping leg straight, holding while coughing.  Will monitor q 15 minutes x 2 hours then q 30 minutes. Bedrest with HOB down until 1230, pt aware

## 2014-02-14 ENCOUNTER — Inpatient Hospital Stay (HOSPITAL_COMMUNITY): Payer: Medicare Other

## 2014-02-14 LAB — VANCOMYCIN, TROUGH: Vancomycin Tr: 21.5 ug/mL — ABNORMAL HIGH (ref 10.0–20.0)

## 2014-02-14 LAB — GLUCOSE, CAPILLARY
Glucose-Capillary: 130 mg/dL — ABNORMAL HIGH (ref 70–99)
Glucose-Capillary: 125 mg/dL — ABNORMAL HIGH (ref 70–99)
Glucose-Capillary: 123 mg/dL — ABNORMAL HIGH (ref 70–99)

## 2014-02-14 LAB — COMPREHENSIVE METABOLIC PANEL
ALT: 6 U/L (ref 0–35)
ANION GAP: 10 (ref 5–15)
AST: 9 U/L (ref 0–37)
Albumin: 2.6 g/dL — ABNORMAL LOW (ref 3.5–5.2)
Alkaline Phosphatase: 81 U/L (ref 39–117)
BILIRUBIN TOTAL: 0.3 mg/dL (ref 0.3–1.2)
BUN: 9 mg/dL (ref 6–23)
CHLORIDE: 105 meq/L (ref 96–112)
CO2: 27 mEq/L (ref 19–32)
Calcium: 8.4 mg/dL (ref 8.4–10.5)
Creatinine, Ser: 0.66 mg/dL (ref 0.50–1.10)
GFR calc non Af Amer: >90 mL/min (ref >90)
GLUCOSE: 118 mg/dL — AB (ref 70–99)
POTASSIUM: 4.3 meq/L (ref 3.7–5.3)
SODIUM: 142 meq/L (ref 137–147)
Total Protein: 6.3 g/dL (ref 6.0–8.3)

## 2014-02-14 LAB — MAGNESIUM: Magnesium: 1.4 mg/dL — ABNORMAL LOW (ref 1.5–2.5)

## 2014-02-14 LAB — PHOSPHORUS: Phosphorus: 3.7 mg/dL (ref 2.3–4.6)

## 2014-02-14 LAB — CBC
HEMATOCRIT: 27.9 % — AB (ref 36.0–46.0)
HEMOGLOBIN: 8.6 g/dL — AB (ref 12.0–15.0)
MCH: 26.5 pg (ref 26.0–34.0)
MCHC: 30.8 g/dL (ref 30.0–36.0)
MCV: 86.1 fL (ref 78.0–100.0)
Platelets: 201 10*3/uL (ref 150–400)
RBC: 3.24 MIL/uL — ABNORMAL LOW (ref 3.87–5.11)
RDW: 15.9 % — ABNORMAL HIGH (ref 11.5–15.5)
WBC: 5.7 10*3/uL (ref 4.0–10.5)

## 2014-02-14 MED ORDER — LEVALBUTEROL HCL 0.63 MG/3ML IN NEBU: 0.6300 mg | INHALATION_SOLUTION | Freq: Four times a day (QID) | RESPIRATORY_TRACT | Status: DC | PRN

## 2014-02-14 MED ORDER — VANCOMYCIN HCL 10 G IV SOLR
1250.0000 mg | Freq: Two times a day (BID) | INTRAVENOUS | Status: DC
  Administered 2014-02-14 – 2014-02-16 (×4): 1250 mg via INTRAVENOUS
  Filled 2014-02-14 (×6): qty 1250

## 2014-02-14 MED ORDER — FUROSEMIDE 10 MG/ML IJ SOLN
40.0000 mg | Freq: Once | INTRAMUSCULAR | Status: AC
Start: 2014-02-14 — End: 2014-02-14
  Administered 2014-02-14: 40 mg via INTRAVENOUS
  Filled 2014-02-14: qty 4

## 2014-02-14 MED ORDER — MAGNESIUM SULFATE 40 MG/ML IJ SOLN
2.0000 g | Freq: Once | INTRAMUSCULAR | Status: AC
  Administered 2014-02-14: 2 g via INTRAVENOUS
  Filled 2014-02-14: qty 50

## 2014-02-14 MED ORDER — SODIUM CHLORIDE 0.9 % IJ SOLN: 10.0000 mL | INTRAMUSCULAR | Status: DC | PRN

## 2014-02-14 MED ORDER — LEVALBUTEROL HCL 0.63 MG/3ML IN NEBU
0.6300 mg | INHALATION_SOLUTION | Freq: Three times a day (TID) | RESPIRATORY_TRACT | Status: DC
  Administered 2014-02-14 – 2014-02-16 (×5): 0.63 mg via RESPIRATORY_TRACT
  Filled 2014-02-14 (×9): qty 3

## 2014-02-14 MED ORDER — SODIUM CHLORIDE 0.9 % IJ SOLN
10.0000 mL | Freq: Two times a day (BID) | INTRAMUSCULAR | Status: DC
  Administered 2014-02-14 – 2014-02-15 (×4): 10 mL

## 2014-02-14 NOTE — Progress Notes (Signed)
Pt arrived from 2S, VSS, family present & aware.

## 2014-02-14 NOTE — Progress Notes (Signed)
PULMONARY / CRITICAL CARE MEDICINE   Name: Judy Reeves MRN: [id number] DOB: [birth year]    ADMISSION DATE:  02/12/2014 CONSULTATION DATE:  02/12/2014  REFERRING MD :  Ofilia Neas  CHIEF COMPLAINT:  Short of breath  INITIAL PRESENTATION:  58 yo female former smoker had hospitalization at [facility] in July 2015 for LLL Streptococcal PNA complicated by complex Lt pleural effusion.  She had VATS decortication on 8/12.  PCCM consulted to assist with pulmonary management in ICU.  STUDIES:   SIGNIFICANT EVENTS: 8/12 Admit, Lt VATS decortication, Extubated in PACU  SUBJECTIVE:  No events overnight, the patient states she would like to go home. She has no complaints of pain, has advanced her diet and mobilization. CTS placed 3s transfer orders and would like to D/c chest tube tomorrow.  VITAL SIGNS: Temp:  [97.4 F (36.3 C)-98.3 F (36.8 C)] 98 F (36.7 C) (08/14 0404) Pulse Rate:  [63-100] 84 (08/14 0700) Resp:  [14-26] 17 (08/14 0700) BP: (87-122)/(30-66) 108/44 mmHg (08/14 0700) SpO2:  [94 %-100 %] 95 % (08/14 0700) Arterial Line BP: (158)/(80) 158/80 mmHg (08/13 0800) Weight:  [215 lb 2.7 oz (97.6 kg)] 215 lb 2.7 oz (97.6 kg) (08/14 0615) HEMODYNAMICS:   VENTILATOR SETTINGS:   INTAKE / OUTPUT:  Intake/Output Summary (Last 24 hours) at 02/14/14 0742 Last data filed at 02/14/14 0700  Gross per 24 hour  Intake   3830 ml  Output   4555 ml  Net   -725 ml    PHYSICAL EXAMINATION: General: Obese female sitting in the chair in NAD. Neuro: A&O x 3, non-focal.  HEENT: [place]/AT. PERRL, sclerae anicteric. Cardiovascular: RRR, no M/R/G.  Lungs: Respirations even and unlabored, diminished at left base,  with left chest tube in place. Abdomen: Obese, BS hypoactive, soft, NT/ND.  Musculoskeletal: No gross deformities, no edema.  Skin: Intact, warm, no rashes.  LABS:  CBC  Recent Labs Lab 02/11/14 1222 02/13/14 0400 02/14/14 0400  WBC 6.4 7.0 5.7  HGB 10.0* 8.3* 8.6*   HCT 32.2* 26.2* 27.9*  PLT 253 209 201   Coag's  Recent Labs Lab 02/11/14 1222  APTT 24  INR 0.97   BMET  Recent Labs Lab 02/11/14 1222 02/13/14 0400 02/14/14 0400  NA 138 141 142  K 4.1 4.5 4.3  CL 103 105 105  CO2 21 26 27   BUN 12 11 9   CREATININE 0.65 0.61 0.66  GLUCOSE 120* 122* 118*   Electrolytes  Recent Labs Lab 02/11/14 1222 02/13/14 0400 02/14/14 0400  CALCIUM 8.7 8.2* 8.4  MG  --   --  1.4*  PHOS  --   --  3.7   Sepsis Markers No results found for this basename: LATICACIDVEN, PROCALCITON, O2SATVEN,  in the last 168 hours ABG  Recent Labs Lab 02/11/14 1222 02/12/14 2109 02/13/14 0410  PHART 7.420 7.385 7.405  PCO2ART 37.8 45.0 41.1  PO2ART 63.4* 73.0* 68.0*   Liver Enzymes  Recent Labs Lab 02/11/14 1222 02/14/14 0400  AST 13 9  ALT 7 6  ALKPHOS 79 81  BILITOT 0.3 0.3  ALBUMIN 2.9* 2.6*   Cardiac Enzymes No results found for this basename: TROPONINI, PROBNP,  in the last 168 hours Glucose  Recent Labs Lab 02/12/14 1808 02/12/14 2208 02/13/14 0802 02/13/14 1153 02/13/14 1538 02/13/14 2151  GLUCAP 110* 123* 127* 118* 116* 111*   Imaging Dg Chest Port 1 View  02/13/2014   CLINICAL DATA:  Empyema drainage  EXAM: PORTABLE CHEST - 1 VIEW  COMPARISON:  February 12, 2014  FINDINGS: Chest tube remains on the left. Central catheter tip is in the superior vena cava near the cavoatrial junction. No pneumothorax. There is persistent consolidation the left base with small left effusion. Right lung is clear except for mild right base atelectatic change. Heart is upper normal in size with pulmonary vascularity within normal limits. No adenopathy appreciable.  IMPRESSION: Persistent left base consolidation with small left effusion. Atelectatic change right base. No pneumothorax. No change in tube and catheter positions.   Electronically Signed   By: Bretta BangWilliam  Woodruff M.D.   On: 02/13/2014 08:07   ASSESSMENT / PLAN:  PULMONARY Lt chest tube  8/12 >>   A: Hx of COPD. Acute on chronic hypoxic respiratory failure. S/p Lt VATS decortication 8/12. Supplemental O2- 4 L P:   - Oxygen to keep SaO2 > 90% - F/u CXR in am  - Post-op care, chest tubes per TCTS - Bronchial hygiene - Symbicort, xopenex - See ID for abx  CARDIOVASCULAR Rt IJ CVL 8/12 >>  A:  Hx of HTN, hyperlipidemia. P:  - Monitor hemodynamics. - Pravachol (zocor while in hospital), ASA. - Tele monitoring.  RENAL A:   CKD in setting of DM. Hypomagnesemia P:   - Mg scheduled - Monitor renal fx, urine outpt, electrolytes. - Allow pos balance. - pCXR in am.  GASTROINTESTINAL A:   Nutrition. Hypoalbuminemia P:   - Advance diet as tolerated - Protonix for SUP  HEMATOLOGIC A:   Anemia of chronic disease. 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   P:   - SSI - Tradjenta, Jentadueto for now - Metformin has been d/ced as outpatient, will d/c from inpatient list as well.  NEUROLOGIC A:   Post-op pain control. DM neuropathy. P:   - Post-op pain control per TCTS - Neurontin  TODAY'S SUMMARY: 58 year old female s/p decortication for empyema, continue abx for now, ISS as ordered, advance diet and mobilization. Will be moved to  3S unit today per CTS. Chest tube to be D/c tomorrow.  PCCM will sign off, please call back if needed.  I have personally obtained a history, examined the  patient, evaluated laboratory and imaging results, formulated the assessment and plan and placed orders.  Alyson ReedyWesam G. Roxas Clymer, M.D. [facility]. Pager: [phone]. After hours pager: [phone].  02/14/2014, 7:42 AM

## 2014-02-14 NOTE — Op Note (Signed)
NAMMarland Kitchen:  Judy Reeves, Nichol          ACCOUNT NO.:  [id number]  MEDICAL RECORD NO.:  [id number]  LOCATION:  2S11C                        FACILITY:  [facility]  PHYSICIAN:  Sheliah PlaneEdward Freddy Spadafora, MD    DATE OF BIRTH:  April 14, 1956  DATE OF PROCEDURE:  02/12/2014 DATE OF DISCHARGE:                              OPERATIVE REPORT   PREOPERATIVE DIAGNOSIS:  Left post pneumonic empyema.  POSTOPERATIVE DIAGNOSIS:  Left post pneumonic empyema.  PROCEDURE:  Video bronchoscopy, left video-assisted thoracoscopy, mini- thoracotomy, drainage of left pleural effusion and empyema, and decortication.  SURGEON:  Sheliah PlaneEdward Jayton Popelka, MD  FIRST ASSISTANT:  Pauline GoodAaron Barrett, PA  BRIEF HISTORY:  The patient is a 58 year old female, who [facility] with a [facility] lower lobe pneumonia.  In followup, she saw [place] [place] [facility].  Followup chest x-ray showed persistent left chest opacity, attempted thoracentesis only removed approximately 50 mL to 60 mL of fluid.  Followup CT scan showed extensive complex loculated effusion on the left side.  The patient was referred by Dr. Eulis FosterBeauford for consideration of surgical decortication and drainage of the post pneumonic empyema.  Risks and options were discussed with the patient, and she was agreeable with proceeding.  DESCRIPTION OF PROCEDURE:  The patient had difficulty having an arterial line placed, but ultimately was placed by Anesthesia in the right femoral artery.  She underwent general endotracheal anesthesia with a single-lumen endotracheal tube.  After appropriate time-out was performed, fiberoptic bronchoscope was passed to the subsegmental level, both the left and right tracheobronchial tree.  There were very little in the way of secretions noted in the tracheobronchial tree.  There were no endobronchial lesions appreciated.  The scope was removed.  Then, the patient was turned in lateral decubitus position with the left side up. The left  chest was prepped.  Second time-out was performed confirming the preoperative site marking.  Using the previous CT scan as guidance, a port incision was made at approximately the 6th intercostal space, posterior axillary line.  As we entered the chest, it was obvious there were significant loculations.  The port incision was enlarged slightly to a small thoracotomy where we were able to remove significant amount of loculated pleural effusion.  Along the left lower lobe, there was an area of thickening pleura that was beginning to develop on the lower lobe, this was removed as single with the left lower lobe able to expand.  We left a single 32 Blake drain in the posterior pleural space where the majority of the loculations had been, this was secured in place.  The incision was then closed with interrupted 0 Vicryl in the muscle layer, running 2-0 Vicryl in the subcutaneous tissue, 3-0 subcuticular stitch in skin edges.  Dermabond was applied.  The patient was awakened and extubated in the operating room and transferred to the recovery room for postoperative care.  Sponge and needle count was reported as correct. Blood loss was approximately 150 mL.  The patient tolerated the procedure without obvious complication.  At the completion of the case, the specimens were confirmed and sent to both pathology and microbiology for cultures and pathologic examination.     Sheliah PlaneEdward Jay Kempe, MD  EG/MEDQ  D:  02/14/2014  T:  02/14/2014  Job:  [id number]  cc:   Bethanie Dicker, MD

## 2014-02-14 NOTE — Progress Notes (Signed)
Physical Therapy Treatment Patient Details Name: Odie Seraatricia M Shadrick MRN: [id number] DOB: 12/13/1955 Today's Date: 02/14/2014    History of Present Illness Streptococcal PNA complicated by complex Lt pleural effusion.  She had VATS decortication on 8/12. PMH- COPD, DM, neuropathy    PT Comments    Pt making excellent progress.  Follow Up Recommendations  No PT follow up     Equipment Recommendations  Rolling walker with 5" wheels (possibly)    Recommendations for Other Services       Precautions / Restrictions Precautions Precautions: None    Mobility  Bed Mobility Overal bed mobility: Needs Assistance Bed Mobility: Sit to Supine       Sit to supine: Min assist   General bed mobility comments: Assist to bring legs back up into bed.  Transfers Overall transfer level: Needs assistance   Transfers: Sit to/from Stand Sit to Stand: Min guard         General transfer comment: Cues for hand placement  Ambulation/Gait Ambulation/Gait assistance: Supervision Ambulation Distance (Feet): 400 Feet Assistive device:  (pushing w/c) Gait Pattern/deviations: Step-through pattern;Decreased stride length     General Gait Details: supervision for lines/tubes.   Stairs            Wheelchair Mobility    Modified Rankin (Stroke Patients Only)       Balance   Sitting-balance support: No upper extremity supported;Feet supported Sitting balance-Leahy Scale: Good     Standing balance support: No upper extremity supported Standing balance-Leahy Scale: Fair                      Cognition Arousal/Alertness: Awake/alert Behavior During Therapy: WFL for tasks assessed/performed Overall Cognitive Status: Within Functional Limits for tasks assessed                      Exercises      General Comments        Pertinent Vitals/Pain Pain Assessment: No/denies pain    Home Living                      Prior Function             PT Goals (current goals can now be found in the care plan section) Progress towards PT goals: Progressing toward goals    Frequency  Min 3X/week    PT Plan Discharge plan needs to be updated    Co-evaluation             End of Session Equipment Utilized During Treatment: Oxygen Activity Tolerance: Patient tolerated treatment well Patient left: in bed;with call bell/phone within reach     Time: 1349-1359 PT Time Calculation (min): 10 min  Charges:  $Gait Training: 8-22 mins                    G Codes:      Jahquez Steffler 02/14/2014, 2:52 PM  [employer] CorporationCary Janera Peugh PT [phone]

## 2014-02-14 NOTE — Plan of Care (Signed)
Problem: Phase II Progression Outcomes Goal: Weaning O2 for sats > or equal to 88% Outcome: Completed/Met Date Met:  02/14/14 Patient weaned down to 2L/O2, which is what her home dose of O2 is. Goal: Discharge plan established Outcome: Completed/Met Date Met:  02/14/14 Home with spouse/family.  Uses home O2.which they get thru [place].

## 2014-02-14 NOTE — Plan of Care (Signed)
Problem: Phase II Progression Outcomes Goal: Pain controlled Outcome: Completed/Met Date Met:  02/14/14 Minimal use of PCA Goal: Tolerates progressive ambulation Outcome: Progressing Ambulated 210f

## 2014-02-14 NOTE — Progress Notes (Signed)
ANTIBIOTIC CONSULT NOTE - FOLLOW UP  Pharmacy Consult for Vancomycin, Rocephin Indication: pneumonia  No Known Allergies Patient Measurements: Height: 5\' 6"  (167.6 cm) Weight: 215 lb 2.7 oz (97.6 kg) IBW/kg (Calculated) : 59.3 Vital Signs: Temp: 98 F (36.7 C) (08/14 1125) Temp src: Oral (08/14 1125) BP: 98/37 mmHg (08/14 0915) Pulse Rate: 101 (08/14 0915) Intake/Output from previous day: 08/13 0701 - 08/14 0700 In: 3830 [P.O.:780; I.V.:2000; IV Piggyback:1050] Out: 1610 [RUEAV:[id number] [Urine:4455; Chest Tube:100] Intake/Output from this shift: Total I/O In: 183 [I.V.:133; IV Piggyback:50] Out: 1500 [Urine:1500] Labs:  Recent Labs  02/11/14 1222 02/13/14 0400 02/14/14 0400  WBC 6.4 7.0 5.7  HGB 10.0* 8.3* 8.6*  PLT 253 209 201  CREATININE 0.65 0.61 0.66   Estimated Creatinine Clearance: 90.3 ml/min (by C-G formula based on Cr of 0.66).  Recent Labs  02/14/14 1100  VANCOTROUGH 21.5*    Assessment: 57 YOF on day#2 of vancomycin and rocephin for pneumonia with empyema s/p VATS on 8/12.   WBC within normal limits, Afebrile. SCr within normal limits and stable.   Vancomycin level slightly above goal on 1.5g IV q12h and will likely continue to accumulate at this dose. Will decrease for target trough ~18.   Goal of Therapy:  Vancomycin trough level 15-20 mcg/ml  Plan:  1. Decrease vancomycin to 1250 mg IV q12h.  2. Continue to follow-up renal function and adjust dosing as needed.  3. Monitor culture results, clinical status, and ability to narrow therapy.   Link SnufferJessica Kamaiya Antilla, PharmD, BCPS Clinical Pharmacist [phone] 02/14/2014,11:54 AM

## 2014-02-14 NOTE — Discharge Summary (Signed)
Physician Discharge Summary  Patient ID: Judy Reeves MRN: [id number] DOB/AGE: 1955-10-04 58 y.o.  Admit date: 02/12/2014 Discharge date: 02/16/2014  Admission Diagnoses:  Patient Active Problem List   Diagnosis Date Noted  . Empyema lung 02/12/2014  . Gallstones 02/04/2014  . Neuropathy due to secondary diabetes mellitus 02/04/2014  . Empyema, left 02/04/2014  . Pleural effusion on left   . COPD (chronic obstructive pulmonary disease)   . Acute and chronic respiratory failure 01/19/2014  . Injury of kidney 01/19/2014  . Diabetes mellitus, type 2 01/19/2014  . PNA (pneumonia) 01/19/2014  . Blood infection 01/19/2014   Discharge Diagnoses:   Patient Active Problem List   Diagnosis Date Noted  . Empyema lung 02/12/2014  . Gallstones 02/04/2014  . Neuropathy due to secondary diabetes mellitus 02/04/2014  . Empyema, left 02/04/2014  . Pleural effusion on left   . COPD (chronic obstructive pulmonary disease)   . Acute and chronic respiratory failure 01/19/2014  . Injury of kidney 01/19/2014  . Diabetes mellitus, type 2 01/19/2014  . PNA (pneumonia) 01/19/2014  . Blood infection 01/19/2014   Discharged Condition: good  History of Present Illness:   Mrs. Judy Reeves is a 58 yo obese oxygen dependent white female referred to [facility] for Empyema.  [facility].  During that hospitalization she was diagnosed with a complex consolidated left lower lobe pneumonia.  Bronchoscopy was done and cultures obtained grew Streptococcus species.  She was treated with IV antibiotics and later discharged.  She gradually improved after discharge.  However, she presented for follow up with Dr. Eulis FosterBeauford on 02/03/2014 at which time CXR showed an increase in her left sided pleural effusion.  Thoracentesis was attempted but little fluid was removed.  She was sent for CT scan which showed increasing left pleural effusion/empyema.  It was  felt she would benefit from surgical intervention and she was referred to [facility] for evaluation.  She was evaluated by Dr. Tyrone SageGerhardt who felt drainage of empyema with decortication would be indicated.  The risks and benefits of the procedure were explained to the patient and she was agreeable to proceed.   Hospital Course:   She [facility].  She was taken to the operating room and underwent Left VATS with drainage of empyema.  She tolerated the procedure with minimal difficulty and was taken to the PACU in stable condition.  The patient was successfully extubated in the PACU and transferred to the SICU in stable condition.  The patient has been oxygen dependent for approximately 15 years.  Critical care consult was obtained to aid in management of severe COPD.  They evaluated the patient and she was placed on Vancomycin and Rocephin until cultures obtained in the operating room were returned.  The patient is progressing with minimal difficulty.  She is maintaining good oxygen saturations using her home regimen of oxygen.  Her chest tubes are not exhibiting an air leak and have been transitioned to water seal.  Cultures obtained in the operating room have remained negative to date.  She is felt medically stable for transfer to step down.  The patient has continued to progress.  Her chest tubes remained free from air leak.  CXR did not show evidence of Pneumothorax and therefore her chest tubes were removed.  Her OR cultures continue to remain negative, but she will be treated with a 7 day course of Augmentin for her Empyema.  She will resume  her home regimen of oxygen.  She is ambulating and tolerating a carb modified diet.  She is medically stable for discharge today 02/16/2014.      Consults: pulmonary/intensive care  Treatments: surgery:   Video bronchoscopy, left video-assisted thoracoscopy, mini- thoracotomy, drainage of left pleural effusion and empyema, and  decortication.  Disposition: Home  Discharge Medications:     Medication List         amoxicillin-clavulanate 875-125 MG per tablet  Commonly known as:  AUGMENTIN  Take 1 tablet by mouth every 12 (twelve) hours.     aspirin EC 81 MG tablet  Take 81 mg by mouth daily.     budesonide-formoterol 160-4.5 MCG/ACT inhaler  Commonly known as:  SYMBICORT  Inhale 1-2 puffs into the lungs 2 (two) times daily.     gabapentin 600 MG tablet  Commonly known as:  NEURONTIN  Take 600 mg by mouth 3 (three) times daily.     linagliptin 5 MG Tabs tablet  Commonly known as:  TRADJENTA  Take 5 mg by mouth daily.     magnesium oxide 400 MG tablet  Commonly known as:  MAG-OX  Take 400 mg by mouth daily.     omeprazole 20 MG tablet  Commonly known as:  PRILOSEC OTC  Take 20 mg by mouth daily.     oxyCODONE 5 MG immediate release tablet  Commonly known as:  Oxy IR/ROXICODONE  Take 1-2 tablets (5-10 mg total) by mouth every 4 (four) hours as needed for severe pain.     pravastatin 20 MG tablet  Commonly known as:  PRAVACHOL  Take 20 mg by mouth daily.          Follow-up Information   Follow up with Delight Ovens, MD On 02/27/2014. (Appointment is at 2:00)    Specialty:  Cardiothoracic Surgery   Contact information:   [address] [place] [address] [place] [address] [phone]       Follow up with [facility] On 02/27/2014. (Please get CXR at 1:00, office is located on first floor of [facility])    Contact information:   [place]       Signed: Lowella Dandy 02/16/2014, 9:36 AM

## 2014-02-14 NOTE — Progress Notes (Addendum)
Patient ID: Judy Reeves, female   DOB: [birth year], 58 y.o.   MRN: [id number] [facility] DAILY ICU PROGRESS NOTE                   [address]            [place] [id number]          [phone]   2 Days Post-Op Procedure(s) (LRB): VIDEO BRONCHOSCOPY (N/A) VIDEO ASSISTED THORACOSCOPY / DECORTICATION (Left) EMPYEMA DRAINAGE (Left)  Total Length of Stay:  LOS: 2 days   Subjective: Good respiratory effort   Objective: Vital signs in last 24 hours: Temp:  [97.4 F (36.3 C)-98.3 F (36.8 C)] 98 F (36.7 C) (08/14 0404) Pulse Rate:  [63-100] 84 (08/14 0700) Cardiac Rhythm:  [-] Normal sinus rhythm (08/14 0400) Resp:  [14-26] 17 (08/14 0700) BP: (87-122)/(30-66) 108/44 mmHg (08/14 0700) SpO2:  [94 %-100 %] 95 % (08/14 0700) Arterial Line BP: (158)/(80) 158/80 mmHg (08/13 0800) Weight:  [215 lb 2.7 oz (97.6 kg)] 215 lb 2.7 oz (97.6 kg) (08/14 0615)  Filed Weights   02/12/14 1139 02/14/14 0615  Weight: 214 lb 8 oz (97.297 kg) 215 lb 2.7 oz (97.6 kg)    Weight change: 10.7 oz (0.303 kg)   Hemodynamic parameters for last 24 hours:    Intake/Output from previous day: 08/13 0701 - 08/14 0700 In: 3830 [P.O.:780; I.V.:2000; IV Piggyback:1050] Out: 2130 [[id number] [Urine:4455; Chest Tube:100]  Intake/Output this shift:    Current Meds: Scheduled Meds: . acetaminophen  1,000 mg Oral 4 times per day   Or  . acetaminophen (TYLENOL) oral liquid 160 mg/5 mL  1,000 mg Oral 4 times per day  . antiseptic oral rinse  7 mL Mouth Rinse BID  . aspirin EC  81 mg Oral Daily  . bisacodyl  10 mg Oral Daily  . budesonide-formoterol  1-2 puff Inhalation BID  . cefTRIAXone (ROCEPHIN)  IV  1 g Intravenous Q24H  . enoxaparin (LOVENOX) injection  30 mg Subcutaneous Q24H  . fentaNYL   Intravenous 6 times per day  . furosemide  40 mg Intravenous Once  . gabapentin  600 mg Oral TID  . insulin aspart  0-24 Units Subcutaneous TID AC & HS  . levalbuterol  0.63 mg Nebulization Q6H  .  linagliptin  5 mg Oral Daily  . lung surgery book   Does not apply Once  . magnesium oxide  400 mg Oral Daily  . metFORMIN  1,000 mg Oral BID WC  . omeprazole  20 mg Oral Daily  . senna-docusate  1 tablet Oral QHS  . simvastatin  10 mg Oral q1800  . vancomycin  1,500 mg Intravenous Q12H   Continuous Infusions: . 0.45 % NaCl with KCl 20 mEq / L 100 mL/hr at 02/14/14 0405   PRN Meds:.diphenhydrAMINE, diphenhydrAMINE, naloxone, ondansetron (ZOFRAN) IV, oxyCODONE, potassium chloride, sodium chloride  General appearance: alert and cooperative Neurologic: intact Heart: regular rate and rhythm, S1, S2 normal, no murmur, click, rub or gallop Lungs: diminished breath sounds LLL Abdomen: soft, non-tender; bowel sounds normal; no masses,  no organomegaly Extremities: extremities normal, atraumatic, no cyanosis or edema and Homans sign is negative, no sign of DVT Wound: no air leak from ct  Lab Results: CBC: Recent Labs  02/13/14 0400 02/14/14 0400  WBC 7.0 5.7  HGB 8.3* 8.6*  HCT 26.2* 27.9*  PLT 209 201   BMET:  Recent Labs  02/13/14 0400 02/14/14 0400  NA 141 142  K 4.5 4.3  CL 105 105  CO2 26 27  GLUCOSE 122* 118*  BUN 11 9  CREATININE 0.61 0.66  CALCIUM 8.2* 8.4    PT/INR:  Recent Labs  02/11/14 1222  LABPROT 12.9  INR 0.97   Radiology: Dg Chest Port 1 View  02/13/2014   CLINICAL DATA:  Empyema drainage  EXAM: PORTABLE CHEST - 1 VIEW  COMPARISON:  February 12, 2014  FINDINGS: Chest tube remains on the left. Central catheter tip is in the superior vena cava near the cavoatrial junction. No pneumothorax. There is persistent consolidation the left base with small left effusion. Right lung is clear except for mild right base atelectatic change. Heart is upper normal in size with pulmonary vascularity within normal limits. No adenopathy appreciable.  IMPRESSION: Persistent left base consolidation with small left effusion. Atelectatic change right base. No pneumothorax. No  change in tube and catheter positions.   Electronically Signed   By: Bretta Bang M.D.   On: 02/13/2014 08:07   Dg Chest Port 1 View  02/12/2014   CLINICAL DATA:  Left empyema. Status post video-assisted thoracoscopy and decortication and video bronchoscopy.  EXAM: PORTABLE CHEST - 1 VIEW  COMPARISON:  Chest x-ray dated 02/11/2014 and chest CT dated 02/03/2014  FINDINGS: Left chest tube in place. Central venous catheter in good position. No pneumothoraces. Pulmonary vascularity is normal. Slight right base atelectasis. Residual pleural thickening and atelectasis at the left base.  IMPRESSION: No pneumothorax. Residual pleural thickening and atelectasis at the left base.   Electronically Signed   By: Geanie Cooley M.D.   On: 02/12/2014 19:47     Assessment/Plan: S/P Procedure(s) (LRB): VIDEO BRONCHOSCOPY (N/A) VIDEO ASSISTED THORACOSCOPY / DECORTICATION (Left) EMPYEMA DRAINAGE (Left) Mobilize Diuresis Plan for transfer to step-down: see transfer orders Cultures negative so far, antibiotics orders per ccm Ct to water seal , d/c chest tube tomorrow, poss home by sunday    Keionna Kinnaird B 02/14/2014 7:25 AM

## 2014-02-14 NOTE — Discharge Instructions (Signed)
Video-Assisted Thoracic Surgery °Care After °Refer to this sheet in the next few weeks. These instructions provide you with information on caring for yourself after your procedure. Your caregiver may also give you more specific instructions. Your procedure has been planned according to current medical practices, but problems sometimes occur. Call your caregiver if you have any problems or questions after your procedure. °HOME CARE INSTRUCTIONS  °· Only take over-the-counter or prescription medications as directed. °· Only take pain medications (narcotics) as directed. °· Do not drive until your caregiver approves. Driving while taking narcotics or soon after surgery can be dangerous, so discuss the specific timing with your caregiver. °· Avoid activities that use your chest muscles, such as lifting heavy objects, for at least 3-4 weeks.   °· Take deep breaths to expand the lungs and to protect against pneumonia. °· Do breathing exercises as directed by your caregiver. If you were given an incentive spirometer to help with breathing, use it as directed. °· You may resume a normal diet and activities when you feel you are able to or as directed. °· Do not take a bath until your caregiver says it is OK. Use the shower instead.   °· Keep the bandage (dressing) covering the area where the chest tube was inserted (incision site) dry for 48 hours. After 48 hours, remove the dressing unless there is new drainage. °· Remove dressings as directed by your caregiver. °· Change dressings if necessary or as directed. °· Keep all follow-up appointments. It is important for you to see your caregiver after surgery to discuss appropriate follow-up care and surveillance, if it is necessary. °SEEK MEDICAL CARE: °· You feel excessive or increasing pain at an incision site. °· You notice bleeding, skin irritation, drainage, swelling, or redness at an incision site. °· There is a bad smell coming from an incision or dressing. °· It feels  like your heart is fluttering or beating rapidly. °· Your pain medication does not relieve your pain. °SEEK IMMEDIATE MEDICAL CARE IF:  °· You have a fever.   °· You have chest pain.  °· You have a rash. °· You have shortness of breath. °· You have trouble breathing.   °· You feel weak, lightheaded, dizzy, or faint.   °MAKE SURE YOU:  °· Understand these instructions.   °· Will watch your condition.   °· Will get help right away if you are not doing well or get worse. °Document Released: 10/15/2012 Document Reviewed: 10/15/2012 °ExitCare® Patient Information ©2015 [employer]. This information is not intended to replace advice given to you by your health care provider. Make sure you discuss any questions you have with your health care provider. ° °

## 2014-02-15 ENCOUNTER — Inpatient Hospital Stay (HOSPITAL_COMMUNITY): Payer: Medicare Other

## 2014-02-15 LAB — CBC
HCT: 27 % — ABNORMAL LOW (ref 36.0–46.0)
Hemoglobin: 8.4 g/dL — ABNORMAL LOW (ref 12.0–15.0)
MCH: 26.3 pg (ref 26.0–34.0)
MCHC: 31.1 g/dL (ref 30.0–36.0)
MCV: 84.4 fL (ref 78.0–100.0)
Platelets: 204 10*3/uL (ref 150–400)
RBC: 3.2 MIL/uL — ABNORMAL LOW (ref 3.87–5.11)
RDW: 16 % — ABNORMAL HIGH (ref 11.5–15.5)
WBC: 5.4 10*3/uL (ref 4.0–10.5)

## 2014-02-15 LAB — GLUCOSE, CAPILLARY
Glucose-Capillary: 130 mg/dL — ABNORMAL HIGH (ref 70–99)
Glucose-Capillary: 134 mg/dL — ABNORMAL HIGH (ref 70–99)
Glucose-Capillary: 161 mg/dL — ABNORMAL HIGH (ref 70–99)
Glucose-Capillary: 143 mg/dL — ABNORMAL HIGH (ref 70–99)

## 2014-02-15 LAB — BASIC METABOLIC PANEL
Anion gap: 9 (ref 5–15)
BUN: 9 mg/dL (ref 6–23)
CHLORIDE: 103 meq/L (ref 96–112)
CO2: 29 mEq/L (ref 19–32)
Calcium: 9.1 mg/dL (ref 8.4–10.5)
Creatinine, Ser: 0.64 mg/dL (ref 0.50–1.10)
Glucose, Bld: 110 mg/dL — ABNORMAL HIGH (ref 70–99)
Potassium: 4.2 mEq/L (ref 3.7–5.3)
Sodium: 141 mEq/L (ref 137–147)

## 2014-02-15 LAB — BODY FLUID CULTURE
Culture: NO GROWTH
Gram Stain: NONE SEEN

## 2014-02-15 LAB — CULTURE, RESPIRATORY W GRAM STAIN

## 2014-02-15 LAB — CULTURE, RESPIRATORY

## 2014-02-15 NOTE — Progress Notes (Signed)
PCA d/c'd per MD order and 300mcg of fentanyl wasted in sink, Delma Postracie Jazia Faraci RN to witness.

## 2014-02-15 NOTE — Progress Notes (Addendum)
      [address]       [place] [id number]             [phone]      3 Days Post-Op Procedure(s) (LRB): VIDEO BRONCHOSCOPY (N/A) VIDEO ASSISTED THORACOSCOPY / DECORTICATION (Left) EMPYEMA DRAINAGE (Left)\  Subjective:  [name] has no complaints this morning.  She states she is hoping to be discharged tomorrow.  Objective: Vital signs in last 24 hours: Temp:  [97.8 F (36.6 C)-98.2 F (36.8 C)] 97.8 F (36.6 C) (08/15 0700) Pulse Rate:  [76-101] 76 (08/15 0745) Cardiac Rhythm:  [-] Normal sinus rhythm (08/15 0745) Resp:  [13-21] 14 (08/15 0745) BP: (78-137)/(37-66) 112/62 mmHg (08/15 0745) SpO2:  [94 %-98 %] 97 % (08/15 0745)  Intake/Output from previous day: 08/14 0701 - 08/15 0700 In: 843 [P.O.:240; I.V.:303; IV Piggyback:300] Out: 2900 [Urine:2850; Chest Tube:50] Intake/Output this shift: Total I/O In: 10 [I.V.:10] Out: 0   General appearance: alert, cooperative and no distress Heart: regular rate and rhythm Lungs: diminished breath sounds left base Abdomen: soft, non-tender; bowel sounds normal; no masses,  no organomegaly Wound: clean and dry  Lab Results:  Recent Labs  02/14/14 0400 02/15/14 0600  WBC 5.7 5.4  HGB 8.6* 8.4*  HCT 27.9* 27.0*  PLT 201 204   BMET:  Recent Labs  02/14/14 0400 02/15/14 0600  NA 142 141  K 4.3 4.2  CL 105 103  CO2 27 29  GLUCOSE 118* 110*  BUN 9 9  CREATININE 0.66 0.64  CALCIUM 8.4 9.1    PT/INR: No results found for this basename: LABPROT, INR,  in the last 72 hours ABG    Component Value Date/Time   PHART 7.405 02/13/2014 0410   HCO3 25.8* 02/13/2014 0410   TCO2 27 02/13/2014 0410   O2SAT 93.0 02/13/2014 0410   CBG (last 3)   Recent Labs  02/14/14 1124 02/14/14 2220 02/15/14 0754  GLUCAP 125* 123* 130*    Assessment/Plan: S/P Procedure(s) (LRB): VIDEO BRONCHOSCOPY (N/A) VIDEO ASSISTED THORACOSCOPY / DECORTICATION (Left) EMPYEMA DRAINAGE (Left)  1. Chest tube- no air  leak appreciated, CXR stable no pneumothorax, continue scarring/effusion left base 2. Pulm- wean oxygen as tolerated, will add flutter valve 3. D/C PCA after chest tube removal 4. D/C IV fluids 5. OR cultures remain negative, on Rocephin, Vanc- ? Need oral ABX at discharge per Dr. Tyrone SageGerhardt, defer to CCM 6. Dispo- patient stable, will d/c chest tube today, wean oxygen as tolerated, possibly home in AM   LOS: 3 days    BARRETT, ERIN 02/15/2014  Patient seen and examined, agree with above

## 2014-02-16 ENCOUNTER — Inpatient Hospital Stay (HOSPITAL_COMMUNITY): Payer: Medicare Other

## 2014-02-16 LAB — GLUCOSE, CAPILLARY: Glucose-Capillary: 147 mg/dL — ABNORMAL HIGH (ref 70–99)

## 2014-02-16 LAB — TISSUE CULTURE: Culture: NO GROWTH

## 2014-02-16 MED ORDER — AMOXICILLIN-POT CLAVULANATE 875-125 MG PO TABS: 1.0000 | ORAL_TABLET | Freq: Two times a day (BID) | ORAL | Status: DC

## 2014-02-16 MED ORDER — OXYCODONE HCL 5 MG PO TABS: 5.0000 mg | ORAL_TABLET | ORAL | Status: AC | PRN

## 2014-02-16 MED ORDER — LEVALBUTEROL HCL 0.63 MG/3ML IN NEBU: 0.6300 mg | INHALATION_SOLUTION | Freq: Two times a day (BID) | RESPIRATORY_TRACT | Status: DC

## 2014-02-16 MED ORDER — AMOXICILLIN-POT CLAVULANATE 875-125 MG PO TABS
1.0000 | ORAL_TABLET | Freq: Two times a day (BID) | ORAL | Status: DC
Start: 2014-02-16 — End: 2014-02-16
  Administered 2014-02-16: 1 via ORAL
  Filled 2014-02-16 (×2): qty 1

## 2014-02-16 NOTE — Progress Notes (Signed)
Pt discharged home per MD order. All discharge instructions reviewed and all questions answered.  

## 2014-02-16 NOTE — Progress Notes (Signed)
CVC d/c'd per MD order. Pt tolerated procedure well.

## 2014-02-16 NOTE — Progress Notes (Signed)
4 Days Post-Op Procedure(s) (LRB): VIDEO BRONCHOSCOPY (N/A) VIDEO ASSISTED THORACOSCOPY / DECORTICATION (Left) EMPYEMA DRAINAGE (Left) Subjective: "I feel perfect" Denies pain, nausea  Objective: Vital signs in last 24 hours: Temp:  [97.5 F (36.4 C)-98.2 F (36.8 C)] 98 F (36.7 C) (08/16 0426) Pulse Rate:  [74-85] 83 (08/16 0426) Cardiac Rhythm:  [-] Normal sinus rhythm (08/16 0426) Resp:  [15-24] 16 (08/16 0426) BP: (107-134)/(43-72) 122/72 mmHg (08/16 0426) SpO2:  [96 %-100 %] 97 % (08/16 0426)  Hemodynamic parameters for last 24 hours:    Intake/Output from previous day: 08/15 0701 - 08/16 0700 In: 10 [I.V.:10] Out: 0  Intake/Output this shift:    General appearance: alert, cooperative and no distress Neurologic: intact Heart: regular rate and rhythm Lungs: diminished breath sounds left > right base Wound: clean and dry  Lab Results:  Recent Labs  02/14/14 0400 02/15/14 0600  WBC 5.7 5.4  HGB 8.6* 8.4*  HCT 27.9* 27.0*  PLT 201 204   BMET:  Recent Labs  02/14/14 0400 02/15/14 0600  NA 142 141  K 4.3 4.2  CL 105 103  CO2 27 29  GLUCOSE 118* 110*  BUN 9 9  CREATININE 0.66 0.64  CALCIUM 8.4 9.1    PT/INR: No results found for this basename: LABPROT, INR,  in the last 72 hours ABG    Component Value Date/Time   PHART 7.405 02/13/2014 0410   HCO3 25.8* 02/13/2014 0410   TCO2 27 02/13/2014 0410   O2SAT 93.0 02/13/2014 0410   CBG (last 3)   Recent Labs  02/15/14 1136 02/15/14 1621 02/15/14 2142  GLUCAP 134* 161* 143*    Assessment/Plan: S/P Procedure(s) (LRB): VIDEO BRONCHOSCOPY (N/A) VIDEO ASSISTED THORACOSCOPY / DECORTICATION (Left) EMPYEMA DRAINAGE (Left) Plan for discharge: see discharge orders  POD # 4  On vanco and ceftriaxone- will change to PO augmentin for another 7 days  Good sats on 2L [place]- has home O2  Discharge later today   LOS: 4 days    Judy Reeves C 02/16/2014

## 2014-02-25 ENCOUNTER — Other Ambulatory Visit: Payer: Self-pay | Admitting: Cardiothoracic Surgery

## 2014-02-25 DIAGNOSIS — J869 Pyothorax without fistula: Secondary | ICD-10-CM

## 2014-02-27 ENCOUNTER — Ambulatory Visit
Admission: RE | Admit: 2014-02-27 | Discharge: 2014-02-27 | Disposition: A | Discharge status: Home / Self Care | Payer: Medicare Other | Source: Ambulatory Visit | Attending: Cardiothoracic Surgery | Admitting: Cardiothoracic Surgery

## 2014-02-27 ENCOUNTER — Encounter: Payer: Self-pay | Admitting: Cardiothoracic Surgery

## 2014-02-27 ENCOUNTER — Ambulatory Visit (INDEPENDENT_AMBULATORY_CARE_PROVIDER_SITE_OTHER): Payer: Self-pay | Admitting: Cardiothoracic Surgery

## 2014-02-27 VITALS — BP 108/71 | HR 85 | Ht 65.0 in | Wt 218.0 lb

## 2014-02-27 DIAGNOSIS — J869 Pyothorax without fistula: Secondary | ICD-10-CM

## 2014-02-27 NOTE — Progress Notes (Signed)
[address]       [place] [address]             [phone]      CHEVON FOMBY [place] Medical Record #[id number] Date of Birth: 08/27/1955  Referring: Bethanie Dicker, MD Primary Care: Dr Jacqlyn Larsen  Chief Complaint:   POST OP FOLLOW UP 02/12/2014   OPERATIVE REPORT  PREOPERATIVE DIAGNOSIS: Left post pneumonic empyema.  POSTOPERATIVE DIAGNOSIS: Left post pneumonic empyema.  PROCEDURE: Video bronchoscopy, left video-assisted thoracoscopy, mini-  thoracotomy, drainage of left pleural effusion and empyema, and  decortication.  SURGEON: Sheliah Plane, MD   History of Present Illness:     Patient returns to office today after left video-assisted thoracoscopy and drainage of empyema August 12. The patient had been noted with left lung pneumonia in [place] . Following her admission for pneumonia followup by Dr. Gerome Apley revealed on CT scan a complex loculated left pleural effusion/empyema. This was drained operatively, the patient has done well. She's had no recurrent fever or chills. She is on home oxygen but notes her respiratory status has improved since discharge.     Past Medical History  Diagnosis Date  . Pleural effusion on left   . COPD (chronic obstructive pulmonary disease)   . Neuropathy due to secondary diabetes mellitus 02/04/2014  . Gallstones 02/04/2014  . PNA (pneumonia) 01/19/2014  . COPD (chronic obstructive pulmonary disease)   . Injury of kidney 01/19/2014  . GERD (gastroesophageal reflux disease)   . H/O hiatal hernia   . Hypertension   . Shortness of breath   . Arthritis      History  Smoking status  . Former Smoker -- 2.00 packs/day for 39 years  . Types: Cigarettes  . Start date: 02/03/1969  . Quit date: 02/04/2008  Smokeless tobacco  . Current User  . Types: Snuff    Comment: POUCH    History  Alcohol Use No     No Known Allergies  Current Outpatient Prescriptions  Medication Sig Dispense Refill  .  aspirin EC 81 MG tablet Take 81 mg by mouth daily.      . budesonide-formoterol (SYMBICORT) 160-4.5 MCG/ACT inhaler Inhale 1-2 puffs into the lungs 2 (two) times daily.      Marland Kitchen gabapentin (NEURONTIN) 600 MG tablet Take 600 mg by mouth 3 (three) times daily.      Marland Kitchen linagliptin (TRADJENTA) 5 MG TABS tablet Take 5 mg by mouth daily.      . magnesium oxide (MAG-OX) 400 MG tablet Take 400 mg by mouth daily.      Marland Kitchen omeprazole (PRILOSEC OTC) 20 MG tablet Take 20 mg by mouth daily.      . pravastatin (PRAVACHOL) 20 MG tablet Take 20 mg by mouth daily.      Marland Kitchen oxyCODONE (OXY IR/ROXICODONE) 5 MG immediate release tablet Take 1-2 tablets (5-10 mg total) by mouth every 4 (four) hours as needed for severe pain.  30 tablet  0   No current facility-administered medications for this visit.       Physical Exam: BP 108/71  Pulse 85  Ht  (1.651 m)  Wt 218 lb (98.884 kg)  BMI 36.28 kg/m2  SpO2 98%  General appearance: alert, cooperative, appears older than stated age and no distress Neurologic: intact Heart: regular rate and rhythm, S1, S2 normal, no murmur, click, rub or gallop Lungs: clear to auscultation bilaterally Abdomen: soft, non-tender; bowel sounds normal; no masses,  no organomegaly Extremities:  extremities normal, atraumatic, no cyanosis or edema and Homans sign is negative, no sign of DVT Wound: Chest tube sutures were removed, incisions are well-healed   Diagnostic Studies & Laboratory data:     Recent Radiology Findings:   Dg Chest 2 View  02/27/2014   CLINICAL DATA:  Empyema, COPD  EXAM: CHEST  2 VIEW  COMPARISON:  02/16/2014  FINDINGS: Pleural fluid/thickening along the lateral left lower hemithorax, improving. Associated scarring.  Underlying chronic interstitial markings/emphysematous changes. No focal consolidation. No pneumothorax.  Cardiomegaly.  Mild degenerative changes of the visualized thoracolumbar spine.  IMPRESSION: Pleural fluid/ thickening along the left lower  hemithorax, corresponding to known empyema, improving.   Electronically Signed   By: Charline Bills M.D.   On: 02/27/2014 12:56      Recent Lab Findings: Lab Results  Component Value Date   WBC 5.4 02/15/2014   HGB 8.4* 02/15/2014   HCT 27.0* 02/15/2014   PLT 204 02/15/2014   GLUCOSE 110* 02/15/2014   ALT 6 02/14/2014   AST 9 02/14/2014   NA 141 02/15/2014   K 4.2 02/15/2014   CL 103 02/15/2014   CREATININE 0.64 02/15/2014   BUN 9 02/15/2014   CO2 29 02/15/2014   INR 0.97 02/11/2014      Assessment / Plan:      Patient progressing well after, surgical drainage of a complex left empyema/pleural effusion without evidence of recurrent infection I've encouraged patient to be sure she gets a flu vaccination this fall They prefer to see Dr. Gerome Apley in followup so they don't have to drive as far.  I'm glad to see her back at the patient or Dr. Gerome Apley requested anytime.  Delight Ovens MD      [address] [place] [place] [address] Office [phone]   Beeper [phone]  02/27/2014 4:58 PM

## 2014-03-11 LAB — FUNGUS CULTURE W SMEAR: Fungal Smear: NONE SEEN

## 2014-03-12 LAB — FUNGUS CULTURE W SMEAR
Fungal Smear: NONE SEEN
Fungal Smear: NONE SEEN

## 2014-03-27 LAB — AFB CULTURE WITH SMEAR (NOT AT ARMC)
Acid Fast Smear: NONE SEEN
Acid Fast Smear: NONE SEEN
Acid Fast Smear: NONE SEEN

## 2016-05-03 IMAGING — CR DG CHEST 1V PORT
1 series · 1 of 1 positions shown · non-contrast
Comparison: 02/13/2014

CLINICAL DATA: Postop from VATS drainage of empyema.

EXAM:
PORTABLE CHEST - 1 VIEW

[AP]
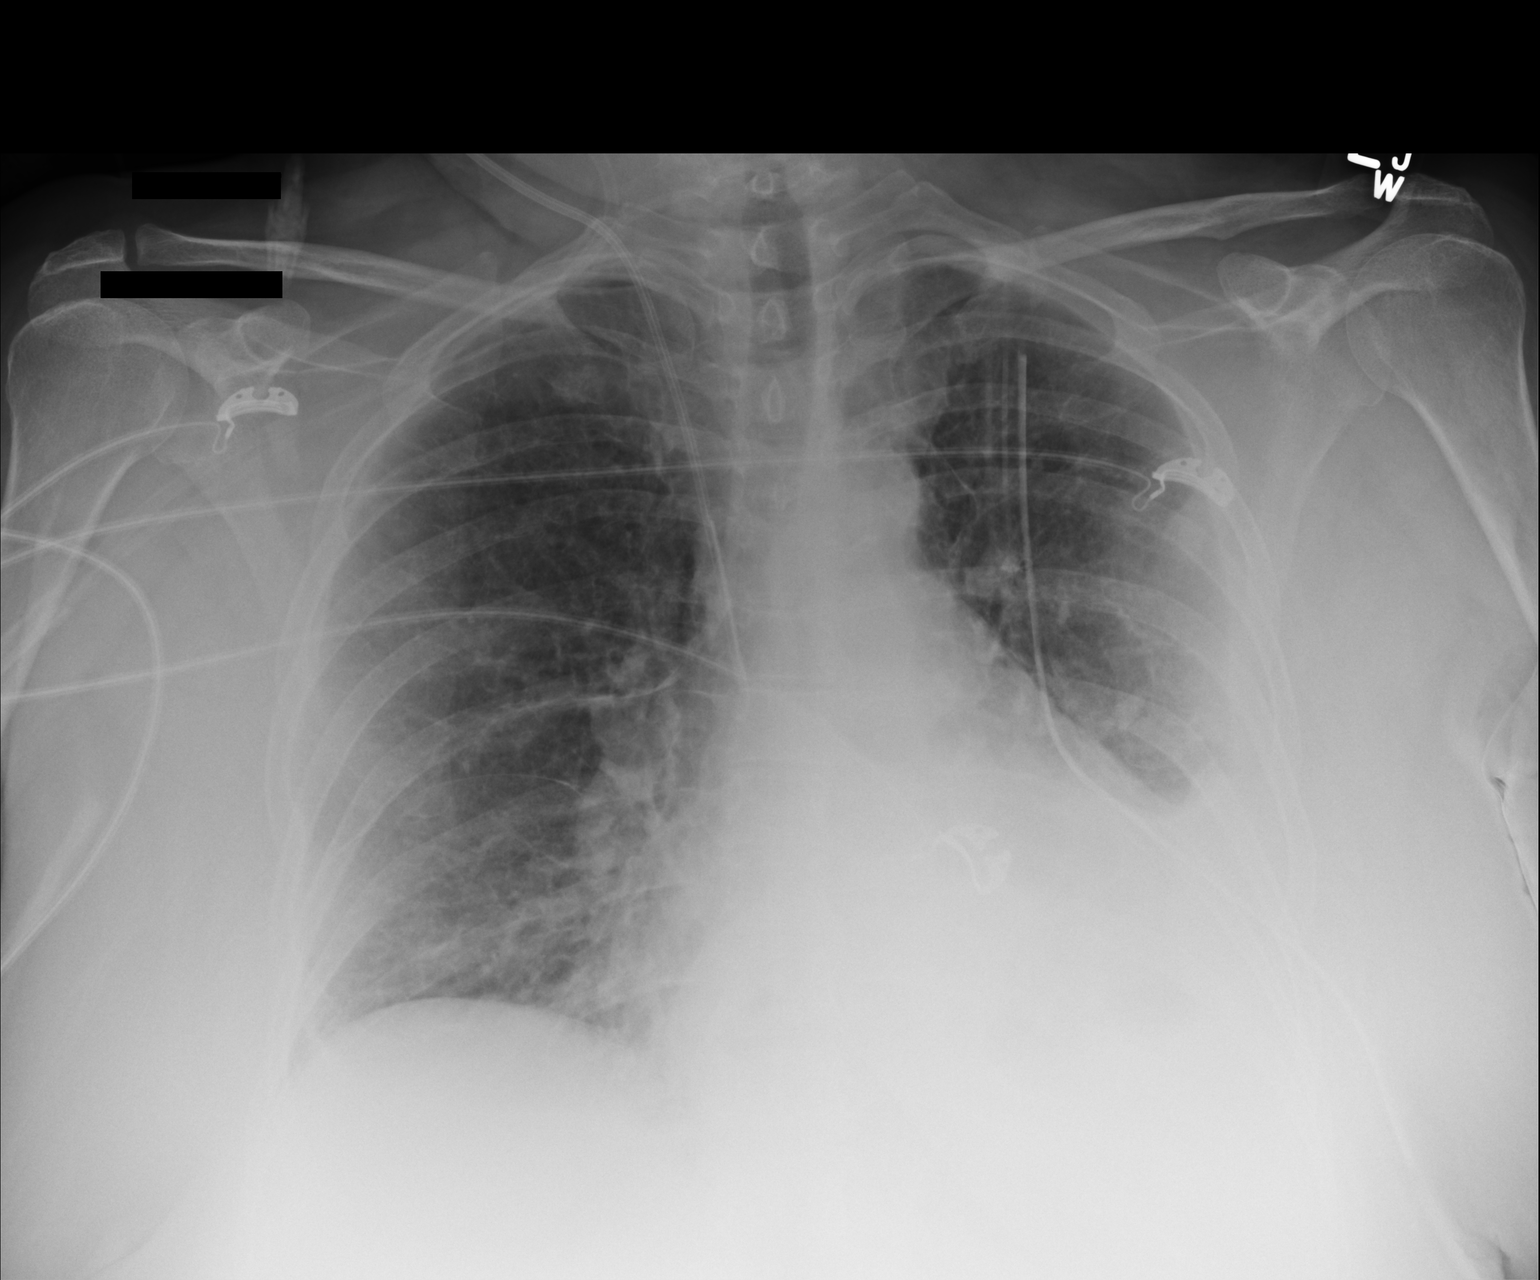

[1 of 1 positions shown; findings below may reference images not displayed]

FINDINGS: Left chest tube and right jugular central venous catheter remain in
appropriate position. Small pleural effusion or thickening along the
left lateral chest wall appears stable. No pneumothorax visualized.

Left lower lobe atelectasis again demonstrated. Hiatal hernia again
seen. Heart size is stable.
IMPRESSION: No significant change compared with prior exam.
